# Patient Record
Sex: Female | Born: 1950 | Race: White | Hispanic: No | Marital: Single | Smoking: Never smoker
Health system: Southern US, Community
[De-identification: ages and names within clinical notes are randomized; demographics above are authoritative.]

## PROBLEM LIST (undated history)

## (undated) DIAGNOSIS — K219 Gastro-esophageal reflux disease without esophagitis: Secondary | ICD-10-CM

## (undated) DIAGNOSIS — E669 Obesity, unspecified: Secondary | ICD-10-CM

## (undated) DIAGNOSIS — M797 Fibromyalgia: Secondary | ICD-10-CM

## (undated) DIAGNOSIS — E785 Hyperlipidemia, unspecified: Secondary | ICD-10-CM

## (undated) DIAGNOSIS — D2330 Other benign neoplasm of skin of unspecified part of face: Secondary | ICD-10-CM

## (undated) DIAGNOSIS — M1990 Unspecified osteoarthritis, unspecified site: Secondary | ICD-10-CM

## (undated) DIAGNOSIS — IMO0002 Reserved for concepts with insufficient information to code with codable children: Secondary | ICD-10-CM

## (undated) DIAGNOSIS — D649 Anemia, unspecified: Secondary | ICD-10-CM

## (undated) DIAGNOSIS — F329 Major depressive disorder, single episode, unspecified: Secondary | ICD-10-CM

## (undated) DIAGNOSIS — J309 Allergic rhinitis, unspecified: Secondary | ICD-10-CM

## (undated) DIAGNOSIS — F32A Depression, unspecified: Secondary | ICD-10-CM

## (undated) DIAGNOSIS — I10 Essential (primary) hypertension: Secondary | ICD-10-CM

## (undated) DIAGNOSIS — L299 Pruritus, unspecified: Secondary | ICD-10-CM

## (undated) DIAGNOSIS — E739 Lactose intolerance, unspecified: Secondary | ICD-10-CM

## (undated) HISTORY — DX: Unspecified osteoarthritis, unspecified site: M19.90

## (undated) HISTORY — DX: Gastro-esophageal reflux disease without esophagitis: K21.9

## (undated) HISTORY — DX: Anemia, unspecified: D64.9

## (undated) HISTORY — DX: Depression, unspecified: F32.A

## (undated) HISTORY — DX: Hyperlipidemia, unspecified: E78.5

## (undated) HISTORY — DX: Obesity, unspecified: E66.9

## (undated) HISTORY — PX: TONSILLECTOMY: SHX5217

## (undated) HISTORY — PX: CARPAL TUNNEL RELEASE: SHX101

## (undated) HISTORY — DX: Fibromyalgia: M79.7

## (undated) HISTORY — DX: Essential (primary) hypertension: I10

## (undated) HISTORY — DX: Allergic rhinitis, unspecified: J30.9

## (undated) HISTORY — PX: KNEE ARTHROSCOPY: SHX127

## (undated) HISTORY — DX: Pruritus, unspecified: L29.9

## (undated) HISTORY — DX: Other benign neoplasm of skin of unspecified part of face: D23.30

## (undated) HISTORY — DX: Major depressive disorder, single episode, unspecified: F32.9

## (undated) HISTORY — DX: Reserved for concepts with insufficient information to code with codable children: IMO0002

## (undated) HISTORY — DX: Lactose intolerance, unspecified: E73.9

---

## 1998-02-18 ENCOUNTER — Ambulatory Visit (HOSPITAL_BASED_OUTPATIENT_CLINIC_OR_DEPARTMENT_OTHER): Admission: RE | Admit: 1998-02-18 | Discharge: 1998-02-18 | Payer: Self-pay | Admitting: Orthopedic Surgery

## 2000-04-05 ENCOUNTER — Encounter: Payer: Self-pay | Admitting: Internal Medicine

## 2000-11-16 ENCOUNTER — Other Ambulatory Visit: Admission: RE | Admit: 2000-11-16 | Discharge: 2000-11-16 | Payer: Self-pay | Admitting: Internal Medicine

## 2001-08-28 ENCOUNTER — Other Ambulatory Visit: Admission: RE | Admit: 2001-08-28 | Discharge: 2001-08-28 | Payer: Self-pay | Admitting: Internal Medicine

## 2002-11-21 ENCOUNTER — Encounter: Admission: RE | Admit: 2002-11-21 | Discharge: 2002-11-21 | Payer: Self-pay | Admitting: Internal Medicine

## 2003-11-14 ENCOUNTER — Ambulatory Visit: Payer: Self-pay | Admitting: Internal Medicine

## 2003-11-14 ENCOUNTER — Other Ambulatory Visit: Admission: RE | Admit: 2003-11-14 | Discharge: 2003-11-14 | Payer: Self-pay | Admitting: Internal Medicine

## 2004-07-09 ENCOUNTER — Ambulatory Visit: Payer: Self-pay | Admitting: Internal Medicine

## 2004-10-15 ENCOUNTER — Ambulatory Visit: Payer: Self-pay | Admitting: Internal Medicine

## 2004-11-19 ENCOUNTER — Ambulatory Visit: Payer: Self-pay | Admitting: Internal Medicine

## 2004-11-26 ENCOUNTER — Ambulatory Visit: Payer: Self-pay | Admitting: Internal Medicine

## 2005-03-07 ENCOUNTER — Ambulatory Visit: Payer: Self-pay | Admitting: Internal Medicine

## 2005-03-21 ENCOUNTER — Ambulatory Visit: Payer: Self-pay | Admitting: Internal Medicine

## 2005-04-18 ENCOUNTER — Ambulatory Visit: Payer: Self-pay | Admitting: Internal Medicine

## 2005-06-20 ENCOUNTER — Ambulatory Visit: Payer: Self-pay | Admitting: Internal Medicine

## 2005-07-14 ENCOUNTER — Encounter: Admission: RE | Admit: 2005-07-14 | Discharge: 2005-07-14 | Payer: Self-pay | Admitting: Internal Medicine

## 2005-07-22 ENCOUNTER — Encounter: Admission: RE | Admit: 2005-07-22 | Discharge: 2005-07-22 | Payer: Self-pay | Admitting: Internal Medicine

## 2005-09-29 ENCOUNTER — Ambulatory Visit: Payer: Self-pay | Admitting: Internal Medicine

## 2005-11-14 ENCOUNTER — Ambulatory Visit: Payer: Self-pay | Admitting: Internal Medicine

## 2005-11-14 LAB — CONVERTED CEMR LAB
ALT: 18 units/L (ref 0–40)
AST: 20 units/L (ref 0–37)
Albumin: 3.4 g/dL — ABNORMAL LOW (ref 3.5–5.2)
Alkaline Phosphatase: 106 units/L (ref 39–117)
BUN: 13 mg/dL (ref 6–23)
Basophils Absolute: 0.1 10*3/uL (ref 0.0–0.1)
Basophils Relative: 1.2 % — ABNORMAL HIGH (ref 0.0–1.0)
CO2: 31 meq/L (ref 19–32)
Calcium: 9.4 mg/dL (ref 8.4–10.5)
Chloride: 102 meq/L (ref 96–112)
Chol/HDL Ratio, serum: 5.2
Cholesterol: 192 mg/dL (ref 0–200)
Creatinine, Ser: 0.8 mg/dL (ref 0.4–1.2)
Eosinophil percent: 0.8 % (ref 0.0–5.0)
GFR calc non Af Amer: 79 mL/min
Glomerular Filtration Rate, Af Am: 95 mL/min/{1.73_m2}
Glucose, Bld: 121 mg/dL — ABNORMAL HIGH (ref 70–99)
HCT: 38.7 % (ref 36.0–46.0)
HDL: 36.7 mg/dL — ABNORMAL LOW (ref >39.0)
Hemoglobin: 12.7 g/dL (ref 12.0–15.0)
Hgb A1c MFr Bld: 6 % (ref 4.6–6.0)
LDL Cholesterol: 134 mg/dL — ABNORMAL HIGH (ref 0–99)
Lymphocytes Relative: 30.4 % (ref 12.0–46.0)
MCHC: 32.8 g/dL (ref 30.0–36.0)
MCV: 85.7 fL (ref 78.0–100.0)
Monocytes Absolute: 0.5 10*3/uL (ref 0.2–0.7)
Monocytes Relative: 5.6 % (ref 3.0–11.0)
Neutro Abs: 5.5 10*3/uL (ref 1.4–7.7)
Neutrophils Relative %: 62 % (ref 43.0–77.0)
Platelets: 311 10*3/uL (ref 150–400)
Potassium: 4.3 meq/L (ref 3.5–5.1)
RBC: 4.51 M/uL (ref 3.87–5.11)
RDW: 14.3 % (ref 11.5–14.6)
Sodium: 139 meq/L (ref 135–145)
TSH: 0.96 microintl units/mL (ref 0.35–5.50)
Total Bilirubin: 0.7 mg/dL (ref 0.3–1.2)
Total Protein: 7 g/dL (ref 6.0–8.3)
Triglyceride fasting, serum: 106 mg/dL (ref 0–149)
VLDL: 21 mg/dL (ref 0–40)
WBC: 8.9 10*3/uL (ref 4.5–10.5)

## 2005-11-21 ENCOUNTER — Ambulatory Visit: Payer: Self-pay | Admitting: Internal Medicine

## 2006-03-07 ENCOUNTER — Encounter: Payer: Self-pay | Admitting: Internal Medicine

## 2006-03-08 ENCOUNTER — Ambulatory Visit: Payer: Self-pay | Admitting: Internal Medicine

## 2006-06-21 ENCOUNTER — Ambulatory Visit: Payer: Self-pay | Admitting: Internal Medicine

## 2006-07-10 ENCOUNTER — Encounter: Payer: Self-pay | Admitting: Internal Medicine

## 2006-07-10 ENCOUNTER — Ambulatory Visit: Payer: Self-pay | Admitting: Internal Medicine

## 2006-07-10 DIAGNOSIS — K219 Gastro-esophageal reflux disease without esophagitis: Secondary | ICD-10-CM

## 2006-07-10 DIAGNOSIS — E785 Hyperlipidemia, unspecified: Secondary | ICD-10-CM

## 2006-07-10 DIAGNOSIS — I10 Essential (primary) hypertension: Secondary | ICD-10-CM

## 2006-07-10 HISTORY — DX: Hyperlipidemia, unspecified: E78.5

## 2006-07-10 HISTORY — DX: Gastro-esophageal reflux disease without esophagitis: K21.9

## 2006-07-10 HISTORY — DX: Essential (primary) hypertension: I10

## 2006-07-19 ENCOUNTER — Encounter: Payer: Self-pay | Admitting: Internal Medicine

## 2006-07-19 ENCOUNTER — Encounter: Admission: RE | Admit: 2006-07-19 | Discharge: 2006-07-19 | Payer: Self-pay | Admitting: Internal Medicine

## 2006-10-03 ENCOUNTER — Encounter: Payer: Self-pay | Admitting: Internal Medicine

## 2006-10-04 ENCOUNTER — Ambulatory Visit: Payer: Self-pay | Admitting: Internal Medicine

## 2006-10-04 DIAGNOSIS — J069 Acute upper respiratory infection, unspecified: Secondary | ICD-10-CM | POA: Insufficient documentation

## 2006-11-09 ENCOUNTER — Ambulatory Visit: Payer: Self-pay | Admitting: Internal Medicine

## 2006-11-09 LAB — CONVERTED CEMR LAB
ALT: 16 units/L (ref 0–35)
AST: 15 units/L (ref 0–37)
Albumin: 3.7 g/dL (ref 3.5–5.2)
Alkaline Phosphatase: 142 units/L — ABNORMAL HIGH (ref 39–117)
BUN: 10 mg/dL (ref 6–23)
Basophils Absolute: 0.1 10*3/uL (ref 0.0–0.1)
Basophils Relative: 1 % (ref 0.0–1.0)
Bilirubin Urine: NEGATIVE
Bilirubin, Direct: 0.2 mg/dL (ref 0.0–0.3)
Blood in Urine, dipstick: NEGATIVE
CO2: 33 meq/L — ABNORMAL HIGH (ref 19–32)
Calcium: 10.2 mg/dL (ref 8.4–10.5)
Chloride: 104 meq/L (ref 96–112)
Cholesterol: 226 mg/dL (ref 0–200)
Creatinine, Ser: 0.8 mg/dL (ref 0.4–1.2)
Direct LDL: 154.3 mg/dL
Eosinophils Absolute: 0.1 10*3/uL (ref 0.0–0.6)
Eosinophils Relative: 0.5 % (ref 0.0–5.0)
GFR calc Af Amer: 95 mL/min
GFR calc non Af Amer: 79 mL/min
Glucose, Bld: 109 mg/dL — ABNORMAL HIGH (ref 70–99)
Glucose, Urine, Semiquant: NEGATIVE
HCT: 37.2 % (ref 36.0–46.0)
HDL: 33.8 mg/dL — ABNORMAL LOW (ref >39.0)
Hemoglobin: 12.6 g/dL (ref 12.0–15.0)
Ketones, urine, test strip: NEGATIVE
Lymphocytes Relative: 28.8 % (ref 12.0–46.0)
MCHC: 33.9 g/dL (ref 30.0–36.0)
MCV: 86.8 fL (ref 78.0–100.0)
Monocytes Absolute: 0.6 10*3/uL (ref 0.2–0.7)
Monocytes Relative: 5.3 % (ref 3.0–11.0)
Neutro Abs: 6.6 10*3/uL (ref 1.4–7.7)
Neutrophils Relative %: 64.4 % (ref 43.0–77.0)
Nitrite: NEGATIVE
Platelets: 313 10*3/uL (ref 150–400)
Potassium: 4.7 meq/L (ref 3.5–5.1)
Protein, U semiquant: NEGATIVE
RBC: 4.28 M/uL (ref 3.87–5.11)
RDW: 14.1 % (ref 11.5–14.6)
Sodium: 145 meq/L (ref 135–145)
Specific Gravity, Urine: 1.02
TSH: 0.78 microintl units/mL (ref 0.35–5.50)
Total Bilirubin: 0.9 mg/dL (ref 0.3–1.2)
Total CHOL/HDL Ratio: 6.7
Total Protein: 6.8 g/dL (ref 6.0–8.3)
Triglycerides: 234 mg/dL (ref 0–149)
Urobilinogen, UA: 0.2
VLDL: 47 mg/dL — ABNORMAL HIGH (ref 0–40)
WBC Urine, dipstick: NEGATIVE
WBC: 10.4 10*3/uL (ref 4.5–10.5)
pH: 6.5

## 2006-11-28 ENCOUNTER — Ambulatory Visit: Payer: Self-pay | Admitting: Internal Medicine

## 2006-11-28 ENCOUNTER — Encounter: Payer: Self-pay | Admitting: Internal Medicine

## 2006-11-28 ENCOUNTER — Other Ambulatory Visit: Admission: RE | Admit: 2006-11-28 | Discharge: 2006-11-28 | Payer: Self-pay | Admitting: Internal Medicine

## 2006-11-28 DIAGNOSIS — J309 Allergic rhinitis, unspecified: Secondary | ICD-10-CM | POA: Insufficient documentation

## 2006-11-28 HISTORY — DX: Allergic rhinitis, unspecified: J30.9

## 2007-02-26 ENCOUNTER — Ambulatory Visit: Payer: Self-pay | Admitting: Internal Medicine

## 2007-02-26 DIAGNOSIS — IMO0002 Reserved for concepts with insufficient information to code with codable children: Secondary | ICD-10-CM

## 2007-02-26 HISTORY — DX: Reserved for concepts with insufficient information to code with codable children: IMO0002

## 2007-03-22 ENCOUNTER — Ambulatory Visit: Payer: Self-pay | Admitting: Internal Medicine

## 2007-03-22 DIAGNOSIS — M549 Dorsalgia, unspecified: Secondary | ICD-10-CM | POA: Insufficient documentation

## 2007-03-27 ENCOUNTER — Encounter: Admission: RE | Admit: 2007-03-27 | Discharge: 2007-03-27 | Payer: Self-pay | Admitting: Internal Medicine

## 2007-04-18 ENCOUNTER — Encounter: Payer: Self-pay | Admitting: Internal Medicine

## 2007-04-30 ENCOUNTER — Encounter: Payer: Self-pay | Admitting: Internal Medicine

## 2007-05-01 ENCOUNTER — Ambulatory Visit: Payer: Self-pay | Admitting: Internal Medicine

## 2007-05-16 ENCOUNTER — Ambulatory Visit: Payer: Self-pay

## 2007-05-16 ENCOUNTER — Encounter: Payer: Self-pay | Admitting: Internal Medicine

## 2007-05-24 ENCOUNTER — Telehealth: Payer: Self-pay | Admitting: Internal Medicine

## 2007-05-28 ENCOUNTER — Telehealth: Payer: Self-pay | Admitting: Internal Medicine

## 2007-06-11 ENCOUNTER — Inpatient Hospital Stay (HOSPITAL_COMMUNITY): Admission: RE | Admit: 2007-06-11 | Discharge: 2007-06-13 | Payer: Self-pay | Admitting: Neurosurgery

## 2007-06-11 HISTORY — PX: LUMBAR LAMINECTOMY: SHX95

## 2007-07-12 ENCOUNTER — Encounter: Admission: RE | Admit: 2007-07-12 | Discharge: 2007-07-12 | Payer: Self-pay | Admitting: Neurosurgery

## 2007-07-25 ENCOUNTER — Encounter: Payer: Self-pay | Admitting: Internal Medicine

## 2007-08-15 ENCOUNTER — Encounter: Payer: Self-pay | Admitting: Internal Medicine

## 2007-08-17 ENCOUNTER — Ambulatory Visit: Payer: Self-pay | Admitting: Internal Medicine

## 2007-09-13 ENCOUNTER — Encounter: Payer: Self-pay | Admitting: Internal Medicine

## 2007-09-28 ENCOUNTER — Ambulatory Visit: Payer: Self-pay | Admitting: Internal Medicine

## 2007-09-28 DIAGNOSIS — L299 Pruritus, unspecified: Secondary | ICD-10-CM | POA: Insufficient documentation

## 2007-09-28 HISTORY — DX: Pruritus, unspecified: L29.9

## 2007-10-30 ENCOUNTER — Ambulatory Visit: Payer: Self-pay | Admitting: Internal Medicine

## 2007-11-14 ENCOUNTER — Encounter: Payer: Self-pay | Admitting: Internal Medicine

## 2007-11-14 ENCOUNTER — Ambulatory Visit: Payer: Self-pay | Admitting: Internal Medicine

## 2007-11-23 ENCOUNTER — Ambulatory Visit: Payer: Self-pay | Admitting: Internal Medicine

## 2007-11-23 ENCOUNTER — Encounter: Admission: RE | Admit: 2007-11-23 | Discharge: 2007-11-23 | Payer: Self-pay | Admitting: Internal Medicine

## 2007-11-23 LAB — CONVERTED CEMR LAB
ALT: 17 units/L (ref 0–35)
AST: 18 units/L (ref 0–37)
Albumin: 3.7 g/dL (ref 3.5–5.2)
Alkaline Phosphatase: 136 units/L — ABNORMAL HIGH (ref 39–117)
BUN: 17 mg/dL (ref 6–23)
Basophils Absolute: 0 10*3/uL (ref 0.0–0.1)
Basophils Relative: 0.5 % (ref 0.0–3.0)
Bilirubin Urine: NEGATIVE
Bilirubin, Direct: 0.1 mg/dL (ref 0.0–0.3)
Blood in Urine, dipstick: NEGATIVE
CO2: 27 meq/L (ref 19–32)
Calcium: 9.5 mg/dL (ref 8.4–10.5)
Chloride: 106 meq/L (ref 96–112)
Cholesterol: 193 mg/dL (ref 0–200)
Creatinine, Ser: 0.7 mg/dL (ref 0.4–1.2)
Eosinophils Absolute: 0.1 10*3/uL (ref 0.0–0.7)
Eosinophils Relative: 0.9 % (ref 0.0–5.0)
GFR calc Af Amer: 111 mL/min
GFR calc non Af Amer: 91 mL/min
Glucose, Bld: 128 mg/dL — ABNORMAL HIGH (ref 70–99)
Glucose, Urine, Semiquant: NEGATIVE
HCT: 34.1 % — ABNORMAL LOW (ref 36.0–46.0)
HDL: 32.4 mg/dL — ABNORMAL LOW (ref >39.0)
Hemoglobin: 11.5 g/dL — ABNORMAL LOW (ref 12.0–15.0)
Ketones, urine, test strip: NEGATIVE
LDL Cholesterol: 124 mg/dL — ABNORMAL HIGH (ref 0–99)
Lymphocytes Relative: 27.1 % (ref 12.0–46.0)
MCHC: 33.8 g/dL (ref 30.0–36.0)
MCV: 82.1 fL (ref 78.0–100.0)
Monocytes Absolute: 0.5 10*3/uL (ref 0.1–1.0)
Monocytes Relative: 5.8 % (ref 3.0–12.0)
Neutro Abs: 6 10*3/uL (ref 1.4–7.7)
Neutrophils Relative %: 65.7 % (ref 43.0–77.0)
Nitrite: NEGATIVE
Platelets: 297 10*3/uL (ref 150–400)
Potassium: 4.3 meq/L (ref 3.5–5.1)
Protein, U semiquant: NEGATIVE
RBC: 4.16 M/uL (ref 3.87–5.11)
RDW: 16.1 % — ABNORMAL HIGH (ref 11.5–14.6)
Sodium: 142 meq/L (ref 135–145)
Specific Gravity, Urine: 1.02
TSH: 1.33 microintl units/mL (ref 0.35–5.50)
Total Bilirubin: 0.7 mg/dL (ref 0.3–1.2)
Total CHOL/HDL Ratio: 6
Total Protein: 7.2 g/dL (ref 6.0–8.3)
Triglycerides: 185 mg/dL — ABNORMAL HIGH (ref 0–149)
Urobilinogen, UA: 0.2
VLDL: 37 mg/dL (ref 0–40)
WBC: 9.1 10*3/uL (ref 4.5–10.5)
pH: 5.5

## 2007-11-29 ENCOUNTER — Ambulatory Visit: Payer: Self-pay | Admitting: Internal Medicine

## 2008-03-12 ENCOUNTER — Encounter: Payer: Self-pay | Admitting: Internal Medicine

## 2008-04-02 ENCOUNTER — Ambulatory Visit: Payer: Self-pay | Admitting: Internal Medicine

## 2008-04-02 DIAGNOSIS — E739 Lactose intolerance, unspecified: Secondary | ICD-10-CM

## 2008-04-02 DIAGNOSIS — D649 Anemia, unspecified: Secondary | ICD-10-CM | POA: Insufficient documentation

## 2008-04-02 HISTORY — DX: Anemia, unspecified: D64.9

## 2008-04-02 HISTORY — DX: Lactose intolerance, unspecified: E73.9

## 2008-04-02 LAB — CONVERTED CEMR LAB
Basophils Absolute: 0.1 10*3/uL (ref 0.0–0.1)
Basophils Relative: 0.7 % (ref 0.0–3.0)
Eosinophils Absolute: 0.1 10*3/uL (ref 0.0–0.7)
Eosinophils Relative: 1 % (ref 0.0–5.0)
HCT: 36.9 % (ref 36.0–46.0)
Hemoglobin: 12.3 g/dL (ref 12.0–15.0)
Hgb A1c MFr Bld: 6.5 % (ref 4.6–6.5)
Lymphocytes Relative: 27.1 % (ref 12.0–46.0)
Lymphs Abs: 2.3 10*3/uL (ref 0.7–4.0)
MCHC: 33.4 g/dL (ref 30.0–36.0)
MCV: 84.4 fL (ref 78.0–100.0)
Monocytes Absolute: 0.6 10*3/uL (ref 0.1–1.0)
Monocytes Relative: 6.7 % (ref 3.0–12.0)
Neutro Abs: 5.3 10*3/uL (ref 1.4–7.7)
Neutrophils Relative %: 64.5 % (ref 43.0–77.0)
Platelets: 313 10*3/uL (ref 150.0–400.0)
RBC: 4.38 M/uL (ref 3.87–5.11)
RDW: 16 % — ABNORMAL HIGH (ref 11.5–14.6)
WBC: 8.4 10*3/uL (ref 4.5–10.5)

## 2008-07-18 ENCOUNTER — Ambulatory Visit: Payer: Self-pay | Admitting: Internal Medicine

## 2008-07-18 DIAGNOSIS — D2330 Other benign neoplasm of skin of unspecified part of face: Secondary | ICD-10-CM | POA: Insufficient documentation

## 2008-07-18 HISTORY — DX: Other benign neoplasm of skin of unspecified part of face: D23.30

## 2008-10-22 ENCOUNTER — Encounter: Admission: RE | Admit: 2008-10-22 | Discharge: 2008-10-22 | Payer: Self-pay | Admitting: Neurosurgery

## 2008-12-01 ENCOUNTER — Ambulatory Visit: Payer: Self-pay | Admitting: Internal Medicine

## 2008-12-01 LAB — CONVERTED CEMR LAB
ALT: 13 units/L (ref 0–35)
AST: 15 units/L (ref 0–37)
Albumin: 3.6 g/dL (ref 3.5–5.2)
Alkaline Phosphatase: 128 units/L — ABNORMAL HIGH (ref 39–117)
BUN: 8 mg/dL (ref 6–23)
Basophils Absolute: 0 10*3/uL (ref 0.0–0.1)
Basophils Relative: 0.6 % (ref 0.0–3.0)
Bilirubin Urine: NEGATIVE
Bilirubin, Direct: 0 mg/dL (ref 0.0–0.3)
CO2: 30 meq/L (ref 19–32)
Calcium: 9.3 mg/dL (ref 8.4–10.5)
Chloride: 102 meq/L (ref 96–112)
Cholesterol: 249 mg/dL — ABNORMAL HIGH (ref 0–200)
Creatinine, Ser: 0.7 mg/dL (ref 0.4–1.2)
Direct LDL: 189.1 mg/dL
Eosinophils Absolute: 0.1 10*3/uL (ref 0.0–0.7)
Eosinophils Relative: 0.8 % (ref 0.0–5.0)
GFR calc non Af Amer: 90.75 mL/min (ref >60)
Glucose, Bld: 125 mg/dL — ABNORMAL HIGH (ref 70–99)
Glucose, Urine, Semiquant: NEGATIVE
HCT: 37.6 % (ref 36.0–46.0)
HDL: 35.8 mg/dL — ABNORMAL LOW (ref >39.00)
Hemoglobin: 12.5 g/dL (ref 12.0–15.0)
Hgb A1c MFr Bld: 6.5 % (ref 4.6–6.5)
Ketones, urine, test strip: NEGATIVE
Lymphocytes Relative: 34.4 % (ref 12.0–46.0)
Lymphs Abs: 2.6 10*3/uL (ref 0.7–4.0)
MCHC: 33.4 g/dL (ref 30.0–36.0)
MCV: 86.8 fL (ref 78.0–100.0)
Monocytes Absolute: 0.4 10*3/uL (ref 0.1–1.0)
Monocytes Relative: 6 % (ref 3.0–12.0)
Neutro Abs: 4.4 10*3/uL (ref 1.4–7.7)
Neutrophils Relative %: 58.2 % (ref 43.0–77.0)
Nitrite: NEGATIVE
Platelets: 251 10*3/uL (ref 150.0–400.0)
Potassium: 3.7 meq/L (ref 3.5–5.1)
Protein, U semiquant: NEGATIVE
RBC: 4.33 M/uL (ref 3.87–5.11)
RDW: 14.5 % (ref 11.5–14.6)
Sodium: 142 meq/L (ref 135–145)
Specific Gravity, Urine: 1.02
TSH: 0.75 microintl units/mL (ref 0.35–5.50)
Total Bilirubin: 0.8 mg/dL (ref 0.3–1.2)
Total CHOL/HDL Ratio: 7
Total Protein: 7.4 g/dL (ref 6.0–8.3)
Triglycerides: 220 mg/dL — ABNORMAL HIGH (ref 0.0–149.0)
Urobilinogen, UA: 0.2
VLDL: 44 mg/dL — ABNORMAL HIGH (ref 0.0–40.0)
WBC Urine, dipstick: NEGATIVE
WBC: 7.5 10*3/uL (ref 4.5–10.5)
pH: 5.5

## 2008-12-12 ENCOUNTER — Ambulatory Visit: Payer: Self-pay | Admitting: Internal Medicine

## 2009-01-07 ENCOUNTER — Encounter: Admission: RE | Admit: 2009-01-07 | Discharge: 2009-01-07 | Payer: Self-pay | Admitting: Internal Medicine

## 2009-02-20 ENCOUNTER — Telehealth: Payer: Self-pay | Admitting: Internal Medicine

## 2009-05-12 ENCOUNTER — Ambulatory Visit: Payer: Self-pay | Admitting: Internal Medicine

## 2009-10-07 ENCOUNTER — Ambulatory Visit: Payer: Self-pay | Admitting: Internal Medicine

## 2009-12-02 ENCOUNTER — Ambulatory Visit: Payer: Self-pay | Admitting: Internal Medicine

## 2009-12-02 LAB — CONVERTED CEMR LAB
ALT: 10 units/L (ref 0–35)
AST: 12 units/L (ref 0–37)
Albumin: 4.2 g/dL (ref 3.5–5.2)
Alkaline Phosphatase: 126 units/L — ABNORMAL HIGH (ref 39–117)
BUN: 14 mg/dL (ref 6–23)
Basophils Absolute: 0.1 10*3/uL (ref 0.0–0.1)
Basophils Relative: 0.6 % (ref 0.0–3.0)
Bilirubin Urine: NEGATIVE
Bilirubin, Direct: 0.1 mg/dL (ref 0.0–0.3)
Blood in Urine, dipstick: NEGATIVE
CO2: 28 meq/L (ref 19–32)
Calcium: 9.7 mg/dL (ref 8.4–10.5)
Chloride: 99 meq/L (ref 96–112)
Cholesterol: 267 mg/dL — ABNORMAL HIGH (ref 0–200)
Creatinine, Ser: 0.77 mg/dL (ref 0.40–1.20)
Eosinophils Absolute: 0.1 10*3/uL (ref 0.0–0.7)
Eosinophils Relative: 0.9 % (ref 0.0–5.0)
Glucose, Bld: 114 mg/dL — ABNORMAL HIGH (ref 70–99)
Glucose, Urine, Semiquant: NEGATIVE
HCT: 40.2 % (ref 36.0–46.0)
HDL: 40 mg/dL (ref >39)
Hemoglobin: 13.5 g/dL (ref 12.0–15.0)
Indirect Bilirubin: 0.7 mg/dL (ref 0.0–0.9)
Ketones, urine, test strip: NEGATIVE
LDL Cholesterol: 171 mg/dL — ABNORMAL HIGH (ref 0–99)
Lymphocytes Relative: 32.5 % (ref 12.0–46.0)
Lymphs Abs: 3.2 10*3/uL (ref 0.7–4.0)
MCHC: 33.7 g/dL (ref 30.0–36.0)
MCV: 86.8 fL (ref 78.0–100.0)
Monocytes Absolute: 0.6 10*3/uL (ref 0.1–1.0)
Monocytes Relative: 5.8 % (ref 3.0–12.0)
Neutro Abs: 5.9 10*3/uL (ref 1.4–7.7)
Neutrophils Relative %: 60.2 % (ref 43.0–77.0)
Nitrite: NEGATIVE
Platelets: 253 10*3/uL (ref 150.0–400.0)
Potassium: 4.3 meq/L (ref 3.5–5.3)
Protein, U semiquant: NEGATIVE
RBC: 4.63 M/uL (ref 3.87–5.11)
RDW: 15.8 % — ABNORMAL HIGH (ref 11.5–14.6)
Sodium: 139 meq/L (ref 135–145)
Specific Gravity, Urine: 1.01
TSH: 1.386 microintl units/mL (ref 0.350–4.500)
Total Bilirubin: 0.8 mg/dL (ref 0.3–1.2)
Total CHOL/HDL Ratio: 6.7
Total Protein: 7 g/dL (ref 6.0–8.3)
Triglycerides: 281 mg/dL — ABNORMAL HIGH (ref <150)
Urobilinogen, UA: 0.2
VLDL: 56 mg/dL — ABNORMAL HIGH (ref 0–40)
WBC: 9.8 10*3/uL (ref 4.5–10.5)
pH: 5.5

## 2009-12-14 ENCOUNTER — Ambulatory Visit: Payer: Self-pay | Admitting: Internal Medicine

## 2009-12-14 ENCOUNTER — Encounter: Payer: Self-pay | Admitting: Internal Medicine

## 2010-01-06 ENCOUNTER — Encounter
Admission: RE | Admit: 2010-01-06 | Discharge: 2010-01-06 | Payer: Self-pay | Source: Home / Self Care | Attending: Internal Medicine | Admitting: Internal Medicine

## 2010-01-30 ENCOUNTER — Encounter: Payer: Self-pay | Admitting: Internal Medicine

## 2010-01-31 ENCOUNTER — Encounter: Payer: Self-pay | Admitting: Internal Medicine

## 2010-02-09 NOTE — Progress Notes (Signed)
Summary: Pt req list of pts current medications  Phone Note Call from Patient Call back at Home Phone [phone]   Caller: Patient Summary of Call: Pt lost current list of medications and would like to rcv another copy. Please call when ready for pick up or mail them to patient. Initial call taken by: Lucy Antigua,  February 20, 2009 8:26 AM  Follow-up for Phone Call        notified pt  , med list ready for p/u  Follow-up by: Duard Brady LPN,  February 20, 2009 9:12 AM

## 2010-02-09 NOTE — Assessment & Plan Note (Signed)
Summary: consult re: back pain and pain in lft side/cjr   Vital Signs:  Williams profile:   60 year old female Weight:      216 pounds Temp:     98.1 degrees F oral BP sitting:   142 / 74 Cuff size:   regular  Vitals Entered By: Duard Brady LPN (May 12, 1608 10:29 AM) CC: c/o (L) flank pain, (L) arm pain & numbness in toes   CC:  c/o (L) flank pain and (L) arm pain & numbness in toes.  History of Present Illness: Erin Williams who is seen today for follow up of her hypertension.  She is scheduled to see her back physician tomorrow.  She does have a history of chronic back pain.  She is status post lumbar laminectomy approximately 2 years ago.  She has dyslipidemia, gastroesophageal reflux disease.  New complaints of anklets a mild left flank discomfort  Preventive Screening-Counseling & Management  Alcohol-Tobacco     Smoking Status: never  Allergies: 1)  ! Iodine (Iodine) 2)  ! Phenylbutazone (Phenylbutazone) 3)  ! Septra Ds (Sulfamethoxazole-Trimethoprim) 4)  ! Tetracycline Hcl (Tetracycline Hcl) 5)  ! Theophylline Cr (Theophylline) 6)  Vicodin (Hydrocodone-Acetaminophen)  Past History:  Past Medical History: Reviewed history from 11/29/2007 and no changes required. GERD Hyperlipidemia Hypertension Fibromyalgia Mild obesity peripheral edema Allergic rhinitis chronic low back pain  Past Surgical History: Reviewed history from 11/29/2007 and no changes required. Carpal tunnel release Tonsillectomy right knee arthroscopic surgery 2002 gravida one, para one, aborta zero  flexible sigmoidoscopy March 2002  ETT March 2007 nuclear  medicine stress test June 2009 lumbar laminectomy June 2009  normal bone density November 2009  Family History: Reviewed history from 11/28/2006 and no changes required. father history of diabetes, court-ordered disease with a history breast cancer, metastatic to the brain.  Two brothers, one sister.  Positive diabetes,  hypertension  father died age 11 of aspiration pneumonia, history of diabetes, CAD, senile dementia  Mother died age 70.  Breast cancer, metastatic to the brain  Two brothers, one sister  For diabetes, hypertension that his suicide death  Family history positive colon cancer with cousins and aunts  Social History: Smoking Status:  never  Physical Exam  General:  overweight-appearing.  130/Erin.   Head:  Normocephalic and atraumatic without obvious abnormalities. No apparent alopecia or balding. Mouth:  Oral mucosa and oropharynx without lesions or exudates.  Teeth in good repair. Neck:  No deformities, masses, or tenderness noted. Lungs:  Normal respiratory effort, chest expands symmetrically. Lungs are clear to auscultation, no crackles or wheezes. Heart:  Normal rate and regular rhythm. S1 and S2 normal without gallop, murmur, click, rub or other extra sounds. Abdomen:  Bowel sounds positive,abdomen soft and non-tender without masses, organomegaly or hernias noted.   Impression & Recommendations:  Problem # 1:  BACK PAIN (ICD-724.5)  Her updated medication list for this problem includes:    Voltaren 75 Mg Tbec (Diclofenac sodium) .Marland Kitchen... 1 tablet by mouth twice a day    Flexeril 10 Mg Tabs (Cyclobenzaprine hcl) .Marland Kitchen... 1 three times a day as needed  Her updated medication list for this problem includes:    Voltaren 75 Mg Tbec (Diclofenac sodium) .Marland Kitchen... 1 tablet by mouth twice a day    Flexeril 10 Mg Tabs (Cyclobenzaprine hcl) .Marland Kitchen... 1 three times a day as needed  Problem # 2:  HYPERLIPIDEMIA (ICD-272.4)  Her updated medication list for this problem includes:    Lipitor Erin  Mg Tabs (Atorvastatin calcium) ..... One daily  Her updated medication list for this problem includes:    Lipitor Erin Mg Tabs (Atorvastatin calcium) ..... One daily  Complete Medication List: 1)  Hydrochlorothiazide 25 Mg Tabs (Hydrochlorothiazide) .... Take 1 tablet by mouth once a day 2)  Voltaren 75 Mg  Tbec (Diclofenac sodium) .Marland Kitchen.. 1 tablet by mouth twice a day 3)  Flexeril 10 Mg Tabs (Cyclobenzaprine hcl) .Marland Kitchen.. 1 three times a day as needed 4)  Prozac 20 Mg Caps (Fluoxetine hcl) .Marland Kitchen.. 1 once daily 5)  Fluticasone Propionate 50 Mcg/act Susp (Fluticasone propionate) .... Use daily 6)  Fexofenadine Hcl 180 Mg Tabs (Fexofenadine hcl) .... One daily, as needed 7)  Gabapentin 300 Mg Caps (Gabapentin) .... One tid 8)  Ranitidine Hcl 150 Mg Tabs (Ranitidine hcl) .... One twice daily 9)  Lipitor Erin Mg Tabs (Atorvastatin calcium) .... One daily 10)  Lidoderm 5 % Ptch (Lidocaine) .Marland Kitchen.. 1-3 daily--topical  Williams Instructions: 1)  Please schedule a follow-up appointment in 4 months. 2)  Limit your Sodium (Salt) to less than 2 grams a day(slightly less than 1/2 a teaspoon) to prevent fluid retention, swelling, or worsening of symptoms. 3)  It is important that you exercise regularly at least 20 minutes 5 times a week. If you develop chest pain, have severe difficulty breathing, or feel very tired , stop exercising immediately and seek medical attention. 4)  You need to lose weight. Consider a lower calorie diet and regular exercise.  5)  Check your Blood Pressure regularly. If it is above: 150/90  you should make an appointment. 6)  Take calcium +Vitamin D daily.

## 2010-02-09 NOTE — Assessment & Plan Note (Signed)
Summary: CPX/NJR   Vital Signs:  Patient profile:   60 year old female Height:      62 inches Weight:      211 pounds BMI:     38.73 Temp:     98.0 degrees F oral BP sitting:   120 / 72  (right arm) Cuff size:   regular  Vitals Entered By: Duard Brady LPN (December 14, 2009 2:36 PM) CC: cpx - doing well Is Patient Diabetic? No   CC:  cpx - doing well.  History of Present Illness: 60 year old patient who is seen today for a health maintenance examination.  Medical problems  include hypertension, dyslipidemia, gastroesophageal reflux disease.  Allergies: 1)  ! Iodine (Iodine) 2)  ! Phenylbutazone (Phenylbutazone) 3)  ! Septra Ds (Sulfamethoxazole-Trimethoprim) 4)  ! Tetracycline Hcl (Tetracycline Hcl) 5)  ! Theophylline Cr (Theophylline) 6)  Vicodin (Hydrocodone-Acetaminophen)  Past History:  Past Medical History: Reviewed history from 11/29/2007 and no changes required. GERD Hyperlipidemia Hypertension Fibromyalgia Mild obesity peripheral edema Allergic rhinitis chronic low back pain  Past Surgical History: Reviewed history from 10/07/2009 and no changes required. Carpal tunnel release Tonsillectomy right knee arthroscopic surgery 2002 gravida one, para one, aborta zero  flexible sigmoidoscopy March 2002  ETT March 2007 nuclear  medicine stress test June 2009 lumbar laminectomy June 2009 lumbar surgery 07-2009  normal bone density November 2009  Family History: Reviewed history from 11/28/2006 and no changes required. father history of diabetes, court-ordered disease with a history breast cancer, metastatic to the brain.  Two brothers, one sister.  Positive diabetes, hypertension  father died age 21 of aspiration pneumonia, history of diabetes, CAD, senile dementia  Mother died age 12.  Breast cancer, metastatic to the brain  Two brothers, one sister  For diabetes, hypertension that his suicide death  Family history positive colon cancer  with cousins and aunts  Social History: Reviewed history from 12/12/2008 and no changes required. has not worked in approximately 2 years due to fibromyalgia one daughter lives in the  area disabled due to chronic low back pain  Review of Systems       The patient complains of difficulty walking.  The patient denies anorexia, fever, weight loss, weight gain, vision loss, decreased hearing, hoarseness, chest pain, syncope, dyspnea on exertion, peripheral edema, prolonged cough, headaches, hemoptysis, abdominal pain, melena, hematochezia, severe indigestion/heartburn, hematuria, incontinence, genital sores, muscle weakness, suspicious skin lesions, transient blindness, depression, unusual weight change, abnormal bleeding, enlarged lymph nodes, angioedema, and breast masses.    Physical Exam  General:  overweight-appearing.  overweight-appearing.   Head:  Normocephalic and atraumatic without obvious abnormalities. No apparent alopecia or balding. Eyes:  No corneal or conjunctival inflammation noted. EOMI. Perrla. Funduscopic exam benign, without hemorrhages, exudates or papilledema. Vision grossly normal. Ears:  External ear exam shows no significant lesions or deformities.  Otoscopic examination reveals clear canals, tympanic membranes are intact bilaterally without bulging, retraction, inflammation or discharge. Hearing is grossly normal bilaterally. Nose:  External nasal examination shows no deformity or inflammation. Nasal mucosa are pink and moist without lesions or exudates. Mouth:  Oral mucosa and oropharynx without lesions or exudates.  Neck:  No deformities, masses, or tenderness noted. Chest Wall:  No deformities, masses, or tenderness noted. Breasts:  No mass, nodules, thickening, tenderness, bulging, retraction, inflamation, nipple discharge or skin changes noted.   Lungs:  Normal respiratory effort, chest expands symmetrically. Lungs are clear to auscultation, no crackles or  wheezes. Heart:  Normal rate and regular  rhythm. S1 and S2 normal without gallop, murmur, click, rub or other extra sounds. Abdomen:  Bowel sounds positive,abdomen soft and non-tender without masses, organomegaly or hernias noted. Rectal:  No external abnormalities noted. Normal sphincter tone. No rectal masses or tenderness. Genitalia:  Normal introitus for age, no external lesions, no vaginal discharge, mucosa pink and moist, no vaginal or cervical lesions, no vaginal atrophy, no friaility or hemorrhage, normal uterus size and position, no adnexal masses or tenderness Msk:  No deformity or scoliosis noted of thoracic or lumbar spine.   Pulses:  right dorsalis pedis pulse.  Full ; other pedal pulse is not easily palpable Extremities:  No clubbing, cyanosis, edema, or deformity noted with normal full range of motion of all joints.   Neurologic:  No cranial nerve deficits noted. Station and gait are normal. Plantar reflexes are down-going bilaterally. DTRs are symmetrical throughout. Sensory, motor and coordinative functions appear intact. Skin:  Intact without suspicious lesions or rashes Cervical Nodes:  No lymphadenopathy noted Axillary Nodes:  No palpable lymphadenopathy Inguinal Nodes:  No significant adenopathy Psych:  Cognition and judgment appear intact. Alert and cooperative with normal attention span and concentration. No apparent delusions, illusions, hallucinations   Impression & Recommendations:  Problem # 1:  PREVENTIVE HEALTH CARE (ICD-V70.0)  Complete Medication List: 1)  Hydrochlorothiazide 25 Mg Tabs (Hydrochlorothiazide) .... Take 1 tablet by mouth once a day 2)  Flexeril 10 Mg Tabs (Cyclobenzaprine hcl) .Marland Kitchen.. 1 three times a day as needed 3)  Prozac 20 Mg Caps (Fluoxetine hcl) .Marland Kitchen.. 1 once daily 4)  Fluticasone Propionate 50 Mcg/act Susp (Fluticasone propionate) .... Use daily 5)  Fexofenadine Hcl 180 Mg Tabs (Fexofenadine hcl) .... One daily, as needed 6)  Gabapentin 300  Mg Caps (Gabapentin) .... One tid 7)  Ranitidine Hcl 150 Mg Tabs (Ranitidine hcl) .... One twice daily 8)  Lipitor 80 Mg Tabs (Atorvastatin calcium) .... One daily 9)  Lidoderm 5 % Ptch (Lidocaine) .Marland Kitchen.. 1-3 daily--topical 10)  Celebrex 100 Mg Caps (Celecoxib) .... Qd 11)  Vitamin D 1000 Unit Tabs (Cholecalciferol) .... Qd 12)  Calcium 1500 Mg Tabs (Calcium carbonate) .... Qd  Other Orders: EKG w/ Interpretation (93000)  Patient Instructions: 1)  Please schedule a follow-up appointment in 6 months. 2)  Limit your Sodium (Salt). 3)  It is important that you exercise regularly at least 20 minutes 5 times a week. If you develop chest pain, have severe difficulty breathing, or feel very tired , stop exercising immediately and seek medical attention. 4)  You need to lose weight. Consider a lower calorie diet and regular exercise.  5)  Schedule a colonoscopy/sigmoidoscopy to help detect colon cancer. 6)  Take calcium +Vitamin D daily. Prescriptions: LIPITOR 80 MG TABS (ATORVASTATIN CALCIUM) one daily  #90 x 6   Entered and Authorized by:   Gordy Savers  MD   Signed by:   Gordy Savers  MD on 12/14/2009   Method used:   Electronically to        [id number] * (retail)       [address]       [place], [place]  [address]       Ph: [phone]       Fax: [phone]   RxID:   [id number] RANITIDINE HCL 150 MG TABS (RANITIDINE HCL) one twice daily  #180 x 6   Entered and Authorized by:   Gordy Savers  MD   Signed by:  Gordy Savers  MD on 12/14/2009   Method used:   Electronically to        [id number] * (retail)       [address]       [place], [place]  [address]       Ph: [phone]       Fax: [phone]   RxID:   [id number] GABAPENTIN 300 MG CAPS (GABAPENTIN) one tid  #180 x 4   Entered and Authorized by:   Gordy Savers  MD   Signed by:   Gordy Savers  MD on 12/14/2009   Method used:   Electronically to        [id number] * (retail)       [address]       [place], [place]  [address]       Ph: [phone]       Fax: [phone]   RxID:   [id number] FLUTICASONE PROPIONATE 50 MCG/ACT  SUSP (FLUTICASONE PROPIONATE) use daily  #3 x 4   Entered and Authorized by:   Gordy Savers  MD   Signed by:   Gordy Savers  MD on 12/14/2009   Method used:   Electronically to        [id number] * (retail)       [address]       [place], [place]  [address]       Ph: [phone]       Fax: [phone]   RxID:   [id number] PROZAC 20 MG  CAPS (FLUOXETINE HCL) 1 once daily  #90 x 5   Entered and Authorized by:   Gordy Savers  MD   Signed by:   Gordy Savers  MD on 12/14/2009   Method used:   Electronically to        [id number] * (retail)       [address]       [place], [place]  [address]       Ph: [phone]       Fax: [phone]   RxID:   [id number] FLEXERIL 10 MG  TABS (CYCLOBENZAPRINE HCL) 1 three times a day as needed  #90 x 4   Entered and Authorized by:   Gordy Savers  MD   Signed by:   Gordy Savers  MD on 12/14/2009   Method used:   Electronically to        [id number] * (retail)       [address]       [place], [place]  [address]       Ph: [phone]       Fax: [phone]   RxID:   [id number] HYDROCHLOROTHIAZIDE 25 MG TABS (HYDROCHLOROTHIAZIDE) Take 1 tablet by mouth once a day  #90 x 6   Entered and Authorized by:   Gordy Savers  MD   Signed by:   Gordy Savers  MD on 12/14/2009   Method used:   Electronically to        [id number] * (retail)       [address]       [place], [place]  [address]       Ph: [phone]       Fax: [phone]   RxID:  [id number]    Orders Added: 1)  EKG w/ Interpretation [93000] 2)  Est. Patient 40-64 years [phone]

## 2010-02-09 NOTE — Assessment & Plan Note (Signed)
Summary: CPX/IF   Vital Signs:  Patient Profile:   60 Years Old Female Height:     62 inches Weight:      221 pounds BMI:     40.57 Temp:     98.2 degrees F oral BP sitting:   142 / 74  (left arm)  Vitals Entered By: Raechel Ache, RN (November 28, 2006 2:25 PM)                 Chief Complaint:  CPX, labs done. Had treadmill 2007. C/o reflux, gas, and chest and back discomfort.Marland Kitchen  History of Present Illness: 60 year old female seen today for a wellness exam and a long and a and will Current Allergies: VICODIN (HYDROCODONE-ACETAMINOPHEN)  Past Medical History:    GERD    Hyperlipidemia    Hypertension    Fibromyalgia    Mild obesity    peripheral edema    Allergic rhinitis  Past Surgical History:    Carpal tunnel release    Tonsillectomy    right knee arthroscopic surgery 2002    gravida one, para one, aborta zero        flexible sigmoidoscopy March 2002     ETT March 2007   Family History:    father history of diabetes, court-ordered disease with a history breast cancer, metastatic to the brain.  Two brothers, one sister.  Positive diabetes, hypertension        father died age 92 of aspiration pneumonia, history of diabetes, CAD, senile dementia        Mother died age 76.  Breast cancer, metastatic to the brain        Two brothers, one sister        For diabetes, hypertension    that his suicide death        Family history positive colon cancer with cousins and aunts    Review of Systems       The patient complains of peripheral edema.         complain of paresthesias involving her feet   Physical Exam  General:     overweight-appearing.   Head:     Normocephalic and atraumatic without obvious abnormalities. No apparent alopecia or balding. Eyes:     No corneal or conjunctival inflammation noted. EOMI. Perrla. Funduscopic exam benign, without hemorrhages, exudates or papilledema. Vision grossly normal. Ears:     External ear exam shows no  significant lesions or deformities.  Otoscopic examination reveals clear canals, tympanic membranes are intact bilaterally without bulging, retraction, inflammation or discharge. Hearing is grossly normal bilaterally. Nose:     External nasal examination shows no deformity or inflammation. Nasal mucosa are pink and moist without lesions or exudates. Mouth:     edentulous Neck:     No deformities, masses, or tenderness noted. Chest Wall:     No deformities, masses, or tenderness noted. Breasts:     No mass, nodules, thickening, tenderness, bulging, retraction, inflamation, nipple discharge or skin changes noted.   Lungs:     Normal respiratory effort, chest expands symmetrically. Lungs are clear to auscultation, no crackles or wheezes. Heart:     Normal rate and regular rhythm. S1 and S2 normal without gallop, murmur, click, rub or other extra sounds. Abdomen:     Bowel sounds positive,abdomen soft and non-tender without masses, organomegaly or hernias noted. Rectal:     No external abnormalities noted. Normal sphincter tone. No rectal masses or tenderness. Genitalia:     Normal  introitus for age, no external lesions, no vaginal discharge, mucosa pink and moist, no vaginal or cervical lesions, no vaginal atrophy, no friaility or hemorrhage, normal uterus size and position, no adnexal masses or tenderness Msk:     No deformity or scoliosis noted of thoracic or lumbar spine.   Pulses:     left dorsalis pedis pulse Lovie diminished Neurologic:     No cranial nerve deficits noted. Station and gait are normal. Plantar reflexes are down-going bilaterally. DTRs are symmetrical throughout. Sensory, motor and coordinative functions appear intact.  lower extremities intact to vibration, and soft touch and monofilament testing Cervical Nodes:     No lymphadenopathy noted Axillary Nodes:     No palpable lymphadenopathy Inguinal Nodes:     No significant adenopathy Psych:     Cognition and  judgment appear intact. Alert and cooperative with normal attention span and concentration. No apparent delusions, illusions, hallucinations    Impression & Recommendations:  Problem # 1:  HYPERTENSION (ICD-401.9)  Her updated medication list for this problem includes:    Hydrochlorothiazide 25 Mg Tabs (Hydrochlorothiazide) .Marland Kitchen... Take 1 tablet by mouth once a day   Problem # 2:  HYPERLIPIDEMIA (ICD-272.4)  Her updated medication list for this problem includes:    Lipitor 40 Mg Tabs (Atorvastatin calcium) .Marland Kitchen... 1 once daily   Problem # 3:  GERD (ICD-530.81) lot [id number], EXP 30 jun 09, sanofi pasteur left deltoid IM, 0.5 cc.  Her updated medication list for this problem includes:    Nexium 40 Mg Cpdr (Esomeprazole magnesium) .Marland Kitchen... Take 1 capsule by mouth twice a day   Complete Medication List: 1)  Hydrochlorothiazide 25 Mg Tabs (Hydrochlorothiazide) .... Take 1 tablet by mouth once a day 2)  Lipitor 40 Mg Tabs (Atorvastatin calcium) .Marland Kitchen.. 1 once daily 3)  Nexium 40 Mg Cpdr (Esomeprazole magnesium) .... Take 1 capsule by mouth twice a day 4)  Voltaren 75 Mg Tbec (Diclofenac sodium) .Marland Kitchen.. 1 tablet by mouth twice a day 5)  Darvocet-n 100 100-650 Mg Tabs (Propoxyphene n-apap) .Marland Kitchen.. 1 q6h as needed 6)  Flexeril 10 Mg Tabs (Cyclobenzaprine hcl) .Marland Kitchen.. 1 three times a day as needed 7)  Cymbalta 60 Mg Cpep (Duloxetine hcl) .... One daily  Other Orders: Influenza Vaccine NON MCR (56213)   Patient Instructions: 1)  Please schedule a follow-up appointment in 3 months. 2)  Limit your Sodium (Salt). 3)  It is important that you exercise regularly at least 20 minutes 5 times a week. If you develop chest pain, have severe difficulty breathing, or feel very tired , stop exercising immediately and seek medical attention. 4)  You need to lose weight. Consider a lower calorie diet and regular exercise.     Prescriptions: CYMBALTA 60 MG  CPEP (DULOXETINE HCL) one daily  #90 x 6   Entered and  Authorized by:   Gordy Savers  MD   Signed by:   Gordy Savers  MD on 11/28/2006   Method used:   Print then Give to Patient   RxID:   [id number] FLEXERIL 10 MG  TABS (CYCLOBENZAPRINE HCL) 1 three times a day as needed  #90 x 4   Entered and Authorized by:   Gordy Savers  MD   Signed by:   Gordy Savers  MD on 11/28/2006   Method used:   Print then Give to Patient   RxID:   [id number] DARVOCET-N 100 100-650 MG  TABS (PROPOXYPHENE N-APAP) 1 q6h as needed  #90  x 4   Entered and Authorized by:   Gordy Savers  MD   Signed by:   Gordy Savers  MD on 11/28/2006   Method used:   Print then Give to Patient   RxID:   [id number] VOLTAREN 75 MG TBEC (DICLOFENAC SODIUM) 1 tablet by mouth twice a day  #180 x 6   Entered and Authorized by:   Gordy Savers  MD   Signed by:   Gordy Savers  MD on 11/28/2006   Method used:   Print then Give to Patient   RxID:   [id number] NEXIUM 40 MG CPDR (ESOMEPRAZOLE MAGNESIUM) Take 1 capsule by mouth twice a day  #90 x 6   Entered and Authorized by:   Gordy Savers  MD   Signed by:   Gordy Savers  MD on 11/28/2006   Method used:   Print then Give to Patient   RxID:   [id number] LIPITOR 40 MG TABS (ATORVASTATIN CALCIUM) 1 once daily  #90 x 6   Entered and Authorized by:   Gordy Savers  MD   Signed by:   Gordy Savers  MD on 11/28/2006   Method used:   Print then Give to Patient   RxID:   [id number] HYDROCHLOROTHIAZIDE 25 MG TABS (HYDROCHLOROTHIAZIDE) Take 1 tablet by mouth once a day  #90 x 6   Entered and Authorized by:   Gordy Savers  MD   Signed by:   Gordy Savers  MD on 11/28/2006   Method used:   Print then Give to Patient   RxID:   [id number]  ]  Influenza Vaccine    Vaccine Type: Fluvax Non-MCR    Given by: Raechel Ache, RN  Flu Vaccine Consent Questions    Do you have a history of severe allergic reactions to this  vaccine? no    Any prior history of allergic reactions to egg and/or gelatin? no    Do you have a sensitivity to the preservative Thimersol? no    Do you have a past history of Guillan-Barre Syndrome? no    Do you currently have an acute febrile illness? no    Have you ever had a severe reaction to latex? no    Vaccine information given and explained to patient? yes    Are you currently pregnant? no

## 2010-02-09 NOTE — Assessment & Plan Note (Signed)
Summary: 4 MONTH FOLLOW UP/FLU SHOT/CJR----PT [facility] // RS   Vital Signs:  Patient profile:   60 year old female Weight:      213 pounds Temp:     98.0 degrees F oral BP sitting:   120 / 80  (right arm) Cuff size:   regular  Vitals Entered By: Duard Brady LPN (October 07, 2009 10:04 AM) CC: 4 mos rov - doing well Is Patient Diabetic? No Flu Vaccine Consent Questions     Do you have a history of severe allergic reactions to this vaccine? no    Any prior history of allergic reactions to egg and/or gelatin? no    Do you have a sensitivity to the preservative Thimersol? no    Do you have a past history of Guillan-Barre Syndrome? no    Do you currently have an acute febrile illness? no    Have you ever had a severe reaction to latex? no    Vaccine information given and explained to patient? yes    Are you currently pregnant? no    Lot Number:[id number]   Exp Date:07/10/2010   Site Given  Left Deltoid IM   CC:  4 mos rov - doing well.  History of Present Illness: 60 year old patient who is seen today for follow-up.  She has a history of impaired glucose tolerance, hypertension, dyslipidemia, and gastroesophageal reflux disease.  She has chronic low back pain and in July did have a procedure performed with benefit.  In general, she has done quite well.  She remains on Lipitor 80 mg daily, which she tolerates well  Allergies: 1)  ! Iodine (Iodine) 2)  ! Phenylbutazone (Phenylbutazone) 3)  ! Septra Ds (Sulfamethoxazole-Trimethoprim) 4)  ! Tetracycline Hcl (Tetracycline Hcl) 5)  ! Theophylline Cr (Theophylline) 6)  Vicodin (Hydrocodone-Acetaminophen)  Past History:  Past Medical History: Reviewed history from 11/29/2007 and no changes required. GERD Hyperlipidemia Hypertension Fibromyalgia Mild obesity peripheral edema Allergic rhinitis chronic low back pain  Past Surgical History: Carpal tunnel release Tonsillectomy right knee arthroscopic surgery 2002 gravida  one, para one, aborta zero  flexible sigmoidoscopy March 2002  ETT March 2007 nuclear  medicine stress test June 2009 lumbar laminectomy June 2009 lumbar surgery 07-2009  normal bone density November 2009  Review of Systems       The patient complains of peripheral edema.  The patient denies anorexia, fever, weight loss, weight gain, vision loss, decreased hearing, hoarseness, chest pain, syncope, dyspnea on exertion, prolonged cough, headaches, hemoptysis, abdominal pain, melena, hematochezia, severe indigestion/heartburn, hematuria, incontinence, genital sores, muscle weakness, suspicious skin lesions, transient blindness, difficulty walking, depression, unusual weight change, abnormal bleeding, enlarged lymph nodes, angioedema, and breast masses.    Physical Exam  General:  overweight-appearing.  130/80overweight-appearing.   Head:  Normocephalic and atraumatic without obvious abnormalities. No apparent alopecia or balding. Eyes:  No corneal or conjunctival inflammation noted. EOMI. Perrla. Funduscopic exam benign, without hemorrhages, exudates or papilledema. Vision grossly normal. Mouth:  Oral mucosa and oropharynx without lesions or exudates.  Teeth in good repair. Neck:  No deformities, masses, or tenderness noted. Lungs:  Normal respiratory effort, chest expands symmetrically. Lungs are clear to auscultation, no crackles or wheezes. Heart:  Normal rate and regular rhythm. S1 and S2 normal without gallop, murmur, click, rub or other extra sounds. Abdomen:  Bowel sounds positive,abdomen soft and non-tender without masses, organomegaly or hernias noted. Msk:  No deformity or scoliosis noted of thoracic or lumbar spine.   Extremities:  trace left pedal  edema and trace right pedal edema.  trace left pedal edema.   Skin:  Intact without suspicious lesions or rashes Cervical Nodes:  No lymphadenopathy noted Psych:  Cognition and judgment appear intact. Alert and cooperative with normal  attention span and concentration. No apparent delusions, illusions, hallucinations   Impression & Recommendations:  Problem # 1:  BACK PAIN (ICD-724.5)  The following medications were removed from the medication list:    Voltaren 75 Mg Tbec (Diclofenac sodium) .Marland Kitchen... 1 tablet by mouth twice a day Her updated medication list for this problem includes:    Flexeril 10 Mg Tabs (Cyclobenzaprine hcl) .Marland Kitchen... 1 three times a day as needed    Celebrex 100 Mg Caps (Celecoxib) ..... Qd  The following medications were removed from the medication list:    Voltaren 75 Mg Tbec (Diclofenac sodium) .Marland Kitchen... 1 tablet by mouth twice a day Her updated medication list for this problem includes:    Flexeril 10 Mg Tabs (Cyclobenzaprine hcl) .Marland Kitchen... 1 three times a day as needed    Celebrex 100 Mg Caps (Celecoxib) ..... Qd  Problem # 2:  HYPERTENSION (ICD-401.9)  Her updated medication list for this problem includes:    Hydrochlorothiazide 25 Mg Tabs (Hydrochlorothiazide) .Marland Kitchen... Take 1 tablet by mouth once a day  Her updated medication list for this problem includes:    Hydrochlorothiazide 25 Mg Tabs (Hydrochlorothiazide) .Marland Kitchen... Take 1 tablet by mouth once a day  Problem # 3:  HYPERLIPIDEMIA (ICD-272.4)  Her updated medication list for this problem includes:    Lipitor 80 Mg Tabs (Atorvastatin calcium) ..... One daily  Her updated medication list for this problem includes:    Lipitor 80 Mg Tabs (Atorvastatin calcium) ..... One daily  Complete Medication List: 1)  Hydrochlorothiazide 25 Mg Tabs (Hydrochlorothiazide) .... Take 1 tablet by mouth once a day 2)  Flexeril 10 Mg Tabs (Cyclobenzaprine hcl) .Marland Kitchen.. 1 three times a day as needed 3)  Prozac 20 Mg Caps (Fluoxetine hcl) .Marland Kitchen.. 1 once daily 4)  Fluticasone Propionate 50 Mcg/act Susp (Fluticasone propionate) .... Use daily 5)  Fexofenadine Hcl 180 Mg Tabs (Fexofenadine hcl) .... One daily, as needed 6)  Gabapentin 300 Mg Caps (Gabapentin) .... One tid 7)   Ranitidine Hcl 150 Mg Tabs (Ranitidine hcl) .... One twice daily 8)  Lipitor 80 Mg Tabs (Atorvastatin calcium) .... One daily 9)  Lidoderm 5 % Ptch (Lidocaine) .Marland Kitchen.. 1-3 daily--topical 10)  Celebrex 100 Mg Caps (Celecoxib) .... Qd 11)  Vitamin D 1000 Unit Tabs (Cholecalciferol) .... Qd 12)  Calcium 1500 Mg Tabs (Calcium carbonate) .... Qd  Other Orders: Flu Vaccine 71yrs + MEDICARE PATIENTS (Z6109) Administration Flu vaccine - MCR (U0454)  Patient Instructions: 1)  Please schedule a follow-up appointment in 3 months for annual exam 2)  Limit your Sodium (Salt). 3)  It is important that you exercise regularly at least 20 minutes 5 times a week. If you develop chest pain, have severe difficulty breathing, or feel very tired , stop exercising immediately and seek medical attention. 4)  You need to lose weight. Consider a lower calorie diet and regular exercise.  5)  Take calcium +Vitamin D daily. Prescriptions: LIPITOR 80 MG TABS (ATORVASTATIN CALCIUM) one daily  #30 x 6   Entered and Authorized by:   Gordy Savers  MD   Signed by:   Gordy Savers  MD on 10/07/2009   Method used:   Electronically to        [facility]  #  [id number] * (retail)       [address]       [place], [place]  [address]       Ph: [phone]       Fax: [phone]   RxID:   [id number] RANITIDINE HCL 150 MG TABS (RANITIDINE HCL) one twice daily  #180 x 6   Entered and Authorized by:   Gordy Savers  MD   Signed by:   Gordy Savers  MD on 10/07/2009   Method used:   Electronically to        [id number] * (retail)       [address]       [place], [place]  [address]       Ph: [phone]       Fax: [phone]   RxID:   [id number] GABAPENTIN 300 MG CAPS (GABAPENTIN) one tid  #180 x 4   Entered and Authorized by:   Gordy Savers  MD   Signed by:   Gordy Savers  MD on 10/07/2009   Method used:   Electronically to        [id number] * (retail)        [address]       [place], [place]  [address]       Ph: [phone]       Fax: [phone]   RxID:   [id number] FEXOFENADINE HCL 180 MG  TABS (FEXOFENADINE HCL) one daily, as needed  #90 x 4   Entered and Authorized by:   Gordy Savers  MD   Signed by:   Gordy Savers  MD on 10/07/2009   Method used:   Electronically to        [id number] * (retail)       [address]       [place], [place]  [address]       Ph: [phone]       Fax: [phone]   RxID:   [id number] FLUTICASONE PROPIONATE 50 MCG/ACT  SUSP (FLUTICASONE PROPIONATE) use daily  #3 x 4   Entered and Authorized by:   Gordy Savers  MD   Signed by:   Gordy Savers  MD on 10/07/2009   Method used:   Electronically to        [id number] * (retail)       [address]       [place], [place]  [address]       Ph: [phone]       Fax: [phone]   RxID:   [id number] PROZAC 20 MG  CAPS (FLUOXETINE HCL) 1 once daily  #90 x 5   Entered and Authorized by:   Gordy Savers  MD   Signed by:   Gordy Savers  MD on 10/07/2009   Method used:   Electronically to        [id number] * (retail)       [address]       [place], [place]  [address]       Ph: [phone]       Fax: [phone]   RxID:   [id number] FLEXERIL 10 MG  TABS (CYCLOBENZAPRINE HCL) 1 three times a day as needed  #90 x 4   Entered and Authorized by:   Gordy Savers  MD   Signed by:   Gordy Savers  MD on 10/07/2009   Method used:   Electronically to        [id number] * (retail)       [address]       [place], [place]  [address]       Ph: [phone]       Fax: [phone]   RxID:   [id number] HYDROCHLOROTHIAZIDE 25 MG TABS (HYDROCHLOROTHIAZIDE) Take 1 tablet by mouth once a day  #90 x 6   Entered and Authorized by:   Gordy Savers  MD   Signed by:   Gordy Savers  MD on 10/07/2009   Method used:   Electronically to        [id number] * (retail)       [address]       [place], [place]  [address]       Ph: [phone]       Fax: [phone]   RxID:   [id number]

## 2010-03-03 ENCOUNTER — Encounter: Payer: Self-pay | Admitting: Internal Medicine

## 2010-03-04 ENCOUNTER — Encounter: Payer: Self-pay | Admitting: Internal Medicine

## 2010-03-04 ENCOUNTER — Ambulatory Visit (INDEPENDENT_AMBULATORY_CARE_PROVIDER_SITE_OTHER): Payer: Medicare Other | Admitting: Internal Medicine

## 2010-03-04 VITALS — BP 118/80 | Temp 98.0°F | Wt 206.0 lb

## 2010-03-04 DIAGNOSIS — I10 Essential (primary) hypertension: Secondary | ICD-10-CM

## 2010-03-04 DIAGNOSIS — R350 Frequency of micturition: Secondary | ICD-10-CM

## 2010-03-04 LAB — POCT URINALYSIS DIPSTICK
Bilirubin, UA: NEGATIVE
Ketones, UA: NEGATIVE
Protein, UA: NEGATIVE
Spec Grav, UA: 1.01
pH, UA: 7.5

## 2010-03-04 MED ORDER — CIPROFLOXACIN HCL 500 MG PO TABS: 500.0000 mg | ORAL_TABLET | Freq: Two times a day (BID) | ORAL | Status: DC

## 2010-03-04 MED ORDER — CIPROFLOXACIN HCL 500 MG PO TABS: 500.0000 mg | ORAL_TABLET | Freq: Two times a day (BID) | ORAL | Status: AC

## 2010-03-04 NOTE — Progress Notes (Signed)
  Subjective:    Patient ID: Erin Williams, female    DOB: 09-23-1950, 60 y.o.   MRN: [id number]  HPI   60 year old patient with a several day history of lower abdominal discomfort described as a crampy sensation. She's also noticed some frequent urination urgency but no real dysuria. Denies any fever or flank pain. She has had rare UTIs in the past.  She has a history of hypertension which has been controlled on diuretic therapy. This remained stable.   Review of Systems  Constitutional: Negative.   Genitourinary: Positive for urgency, frequency, hematuria and difficulty urinating. Negative for dysuria and flank pain.       Objective:   Physical Exam  Constitutional: She is oriented to person, place, and time. She appears well-developed and well-nourished.  HENT:  Head: Normocephalic.  Right Ear: External ear normal.  Left Ear: External ear normal.  Mouth/Throat: Oropharynx is clear and moist.  Eyes: Conjunctivae and EOM are normal. Pupils are equal, round, and reactive to light.  Neck: Normal range of motion. Neck supple. No thyromegaly present.  Cardiovascular: Normal rate, regular rhythm and normal heart sounds.         The left dorsalis pedis pulse. Decreased  Pulmonary/Chest: Effort normal and breath sounds normal.  Abdominal: Soft. Bowel sounds are normal. She exhibits no mass. There is no tenderness.  Musculoskeletal: Normal range of motion.  Lymphadenopathy:    She has no cervical adenopathy.  Neurological: She is alert and oriented to person, place, and time.  Skin: Skin is warm and dry. No rash noted.  Psychiatric: She has a normal mood and affect. Her behavior is normal.          Assessment & Plan:   patient has urinary frequency and some urgency as well as crampy abdominal pain. Urinalysis reveals trace hematuria and pyuria. We'll treat for suspected low-grade UTI with Cipro for 5 days  Hypertension stable

## 2010-03-04 NOTE — Patient Instructions (Signed)
Take your antibiotic as prescribed until ALL of it is gone, but stop if you develop a rash, swelling, or any side effects of the medication.  Contact our office as soon as possible if  there are side effects of the medication.  Limit your sodium (Salt) intake  Please check your blood pressure on a regular basis.  If it is consistently greater than 150/90, please make an office appointment.   Return in 6 months for follow-up

## 2010-03-17 IMAGING — RF DG LUMBAR SPINE 2-3V
1 series · 2 of 2 positions shown · non-contrast
Comparison: MRI 03/27/2007

CLINICAL DATA: L3-L4 and L4-L5 P L I F.

LUMBAR SPINE - 2-3 VIEW

[Series 1: run · 2 of 2 slices shown]
[im 1/2]
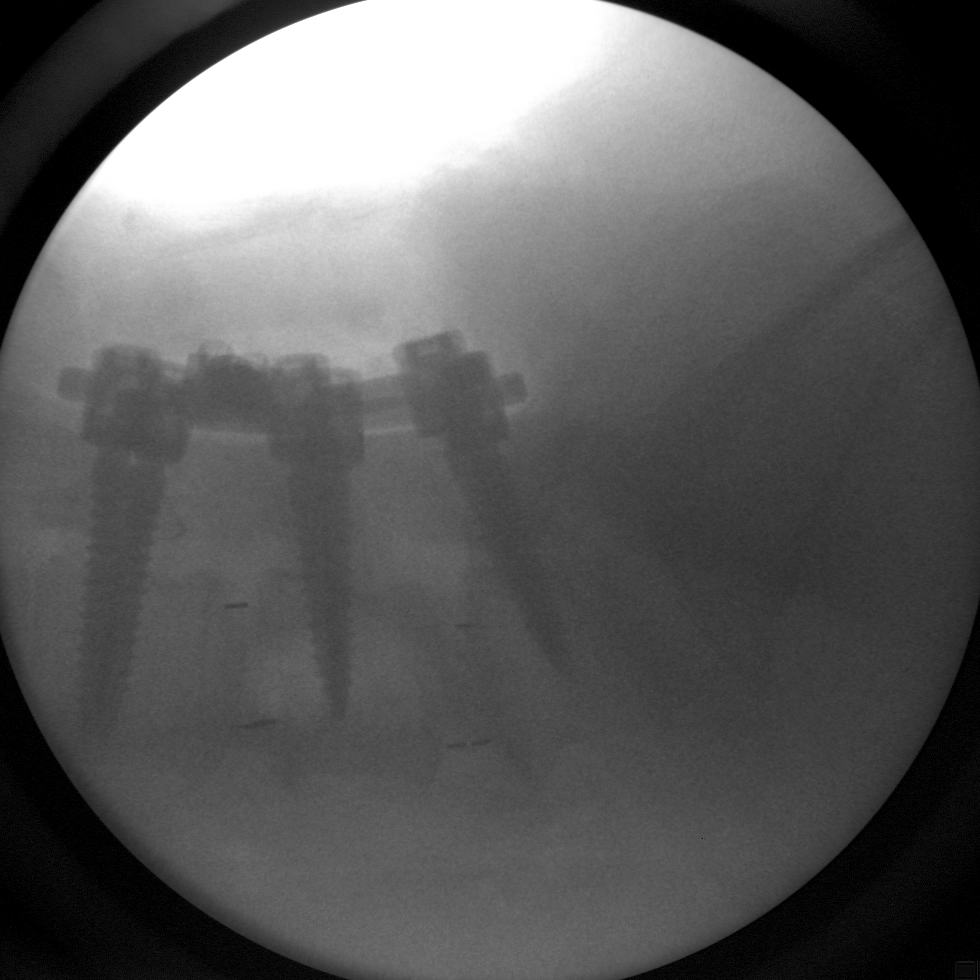
[im 2/2]
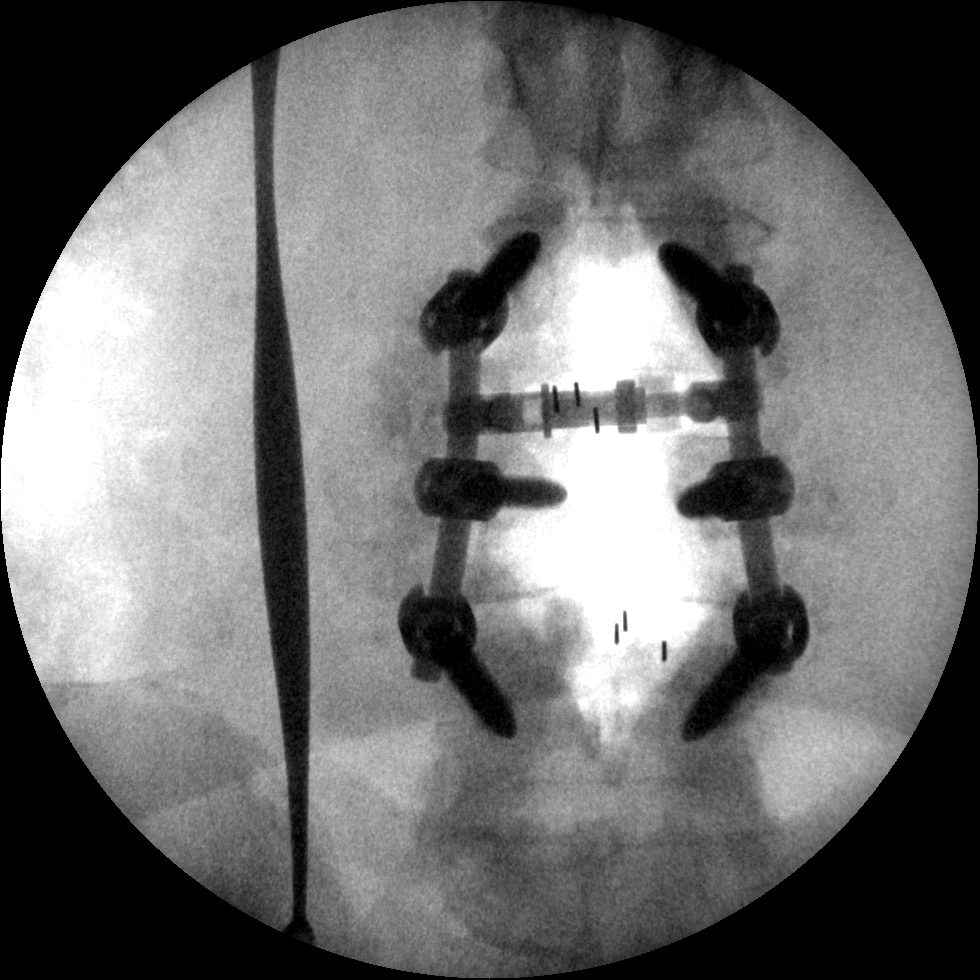

[2 of 2 positions shown; findings below may reference images not displayed]

FINDINGS: AP and lateral intraoperative views.  The patient is
status post posterior fixation at L3-L5.  The L5 level is under
penetrated due to technique.  There is no gross hardware
complication identified.  Interbody fusion material is centrally
positioned.
IMPRESSION: L3-L5 posterior fixation.  Mildly limited evaluation of the
lumbosacral junction.

## 2010-04-07 ENCOUNTER — Encounter: Payer: Self-pay | Admitting: Internal Medicine

## 2010-04-08 ENCOUNTER — Encounter: Payer: Self-pay | Admitting: Internal Medicine

## 2010-04-08 ENCOUNTER — Ambulatory Visit (INDEPENDENT_AMBULATORY_CARE_PROVIDER_SITE_OTHER): Payer: Medicare Other | Admitting: Internal Medicine

## 2010-04-08 VITALS — BP 128/80 | Temp 98.5°F | Wt 208.0 lb

## 2010-04-08 DIAGNOSIS — R109 Unspecified abdominal pain: Secondary | ICD-10-CM

## 2010-04-08 DIAGNOSIS — E785 Hyperlipidemia, unspecified: Secondary | ICD-10-CM

## 2010-04-08 DIAGNOSIS — E739 Lactose intolerance, unspecified: Secondary | ICD-10-CM

## 2010-04-08 DIAGNOSIS — I10 Essential (primary) hypertension: Secondary | ICD-10-CM

## 2010-04-08 DIAGNOSIS — R300 Dysuria: Secondary | ICD-10-CM

## 2010-04-08 LAB — POCT URINALYSIS DIPSTICK
Bilirubin, UA: NEGATIVE
Blood, UA: NEGATIVE
Glucose, UA: NEGATIVE
Ketones, UA: NEGATIVE
pH, UA: 6

## 2010-04-08 MED ORDER — HYDROCHLOROTHIAZIDE 25 MG PO TABS: 25.0000 mg | ORAL_TABLET | Freq: Every day | ORAL | Status: DC

## 2010-04-08 MED ORDER — CELECOXIB 100 MG PO CAPS: 100.0000 mg | ORAL_CAPSULE | Freq: Every day | ORAL | Status: DC

## 2010-04-08 MED ORDER — ATORVASTATIN CALCIUM 80 MG PO TABS: 80.0000 mg | ORAL_TABLET | Freq: Every day | ORAL | Status: DC

## 2010-04-08 MED ORDER — LIDOCAINE 5 % EX PTCH: 1.0000 | MEDICATED_PATCH | Freq: Two times a day (BID) | CUTANEOUS | Status: DC

## 2010-04-08 MED ORDER — FLUOXETINE HCL 20 MG PO CAPS: 20.0000 mg | ORAL_CAPSULE | Freq: Every day | ORAL | Status: DC

## 2010-04-08 MED ORDER — RANITIDINE HCL 150 MG PO TABS: 150.0000 mg | ORAL_TABLET | Freq: Two times a day (BID) | ORAL | Status: AC

## 2010-04-08 MED ORDER — GABAPENTIN 300 MG PO CAPS: 300.0000 mg | ORAL_CAPSULE | Freq: Three times a day (TID) | ORAL | Status: DC

## 2010-04-08 MED ORDER — FLUTICASONE PROPIONATE 50 MCG/ACT NA SUSP: 1.0000 | Freq: Every day | NASAL | Status: DC

## 2010-04-08 MED ORDER — CYCLOBENZAPRINE HCL 10 MG PO TABS: 10.0000 mg | ORAL_TABLET | Freq: Three times a day (TID) | ORAL | Status: DC | PRN

## 2010-04-08 MED ORDER — FEXOFENADINE HCL 180 MG PO TABS: 180.0000 mg | ORAL_TABLET | Freq: Every day | ORAL | Status: AC | PRN

## 2010-04-08 NOTE — Progress Notes (Signed)
  Subjective:    Patient ID: Erin Williams, female    DOB: 1951/01/09, 60 y.o.   MRN: [id number]  HPI  60 year old patient who presents with a one-month history of intermittent lower abdominal pain. No change in her bowel habits. Pain is sporadic. She was treated there earlier for a UTI but denies any dysuria she did have a rash secondary to Cipro therapy. She did have a normal clinical exam in December of last year. She has had a screening sigmoidoscopy in the past but no colonoscopy. There's been no weight loss melena hematochezia.    Review of Systems  Constitutional: Negative.   HENT: Negative for hearing loss, congestion, sore throat, rhinorrhea, dental problem, sinus pressure and tinnitus.   Eyes: Negative for pain, discharge and visual disturbance.  Respiratory: Negative for cough and shortness of breath.   Cardiovascular: Positive for leg swelling. Negative for chest pain and palpitations.  Gastrointestinal: Positive for abdominal pain. Negative for nausea, vomiting, diarrhea, constipation, blood in stool and abdominal distention.  Genitourinary: Negative for dysuria, urgency, frequency, hematuria, flank pain, vaginal bleeding, vaginal discharge, difficulty urinating, vaginal pain and pelvic pain.  Musculoskeletal: Negative for joint swelling, arthralgias and gait problem.  Skin: Negative for rash.  Neurological: Negative for dizziness, syncope, speech difficulty, weakness, numbness and headaches.  Hematological: Negative for adenopathy.  Psychiatric/Behavioral: Negative for behavioral problems, dysphoric mood and agitation. The patient is not nervous/anxious.        Objective:   Physical Exam  Constitutional: She is oriented to person, place, and time. She appears well-developed and well-nourished.  HENT:  Head: Normocephalic.  Right Ear: External ear normal.  Left Ear: External ear normal.  Mouth/Throat: Oropharynx is clear and moist.  Eyes: Conjunctivae and EOM are  normal. Pupils are equal, round, and reactive to light.  Neck: Normal range of motion. Neck supple. No thyromegaly present.  Cardiovascular: Normal rate, regular rhythm, normal heart sounds and intact distal pulses.   Pulmonary/Chest: Effort normal and breath sounds normal.  Abdominal: Soft. Bowel sounds are normal. She exhibits no mass. There is no tenderness. There is no rebound and no guarding.  Musculoskeletal: Normal range of motion.  Lymphadenopathy:    She has no cervical adenopathy.  Neurological: She is alert and oriented to person, place, and time.  Skin: Skin is warm and dry. No rash noted.  Psychiatric: She has a normal mood and affect. Her behavior is normal.          Assessment & Plan:  Intermittent nonspecific abdominal pain. We'll set up for a full colonoscopy if this is negative and abdominal pain persists will consider GYN referral or possibly pelvic ultrasound Hypertension stable Dyslipidemia. Status post UTI

## 2010-04-08 NOTE — Patient Instructions (Signed)
High-fiber diet Limit your sodium (Salt) intake    It is important that you exercise regularly, at least 20 minutes 3 to 4 times per week.  If you develop chest pain or shortness of breath seek  medical attention.   colonoscopy

## 2010-05-19 ENCOUNTER — Ambulatory Visit: Payer: Medicare Other | Admitting: Gastroenterology

## 2010-05-25 NOTE — Op Note (Signed)
NAMEARIANY, KESSELMAN            ACCOUNT NO.:  [id number]   MEDICAL RECORD NO.:  [id number]          PATIENT TYPE:  INP   LOCATION:  3001                         FACILITY:  [facility]   PHYSICIAN:  Kathaleen Maser. Pool, M.D.    DATE OF BIRTH:  July 15, 1950   DATE OF PROCEDURE:  06/11/2007  DATE OF DISCHARGE:                               OPERATIVE REPORT   PREOPERATIVE DIAGNOSES:  1. 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  2. L4-L5 degenerative spondylolisthesis/herniated nucleus pulposus      with marked stenosis.   POSTOPERATIVE DIAGNOSES:  1. 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  2. L4-L5 degenerative spondylolisthesis/herniated nucleus pulposus      with marked stenosis.   PROCEDURE:  Bilateral L3-L4 and L4-L5 decompressive laminectomy with  microdiskectomies and L3, L4, and L5 foraminotomies, more than would be  required for simple interbody fusion alone.  L3-L4 and L4-L5 posterior  lumbar fusion body utilizing tangent interbody allograft wedge, Telamon  interbody PEEK cage, and local autografting.  L3-L5 posterolateral  arthrodesis utilizing segmental pedicle screw sedation and local  autografting.   SURGEON:  Kathaleen Maser. Pool, MD   ASSISTANT:  Donalee Citrin, MD   ANESTHESIA:  General endotracheal.   INDICATIONS:  Ms. Mabry is a 60 year old female with history of severe  back bilateral lower extremity pain failing conservative management.  Workup demonstrates evidence of critical stenosis at L3-L4 and L4-L5  secondary to a variety of factors including disk herniation at both  levels, early degenerative spondylolisthesis, and facet arthropathy both  levels.  The patient has been counseled as to her options.  She decided  proceed with 2-level lumbar decompression and fusion with  instrumentation.   OPERATION IN DETAIL:  The patient was  brought to the operating room and  placed on the operating table in supine position.  After an adequate  level of anesthesia was achieved, the patient was placed prone on the  Wilson frame.  Appropriately padded the patient's lumbar region, prepped  and draped sterilely.  A 10 blade was used to make a curvilinear skin  incision overlying the L2, L3, L4, and L5 levels.  This was carried down  sharply in the midline.  Subperiosteal dissection was then performed  exposing the lamina and facet joints at L2, L3, L4, and L5 as well as  transverse processes of L3, L4, and L5.  Deep self-retaining retractor  was placed.  Intraoperative fluoroscopy was used and levels were  confirmed.  Complete decompressive laminectomy was then performed at L3  and L4 using Leksell rongeurs, Kerrison rongeurs, and high-speed drill  to remove the entire lamina of L3, the entire lamina of L4, and the  superior aspect and lamina of L5.  Complete inferior facetectomies of L3  and L4 were performed bilaterally.  Superior facetectomies of L4 and L5  were performed bilaterally.  Ligamentum flavum was then elevated and  resected in usual fashion using Kerrison rongeurs.  All bone was cleaned  and used in later autografting.  Wide decompressive foraminotomies were  then performed along the course exiting L3, L4, and L5 nerve roots  starting first at the L3-L4 level.  Epidural venous plexus was  coagulated and cut.  Starting on the right side, the thecal sac and  nerve roots were retracted on the right side.  Disk space was then  incised with 15 blade in a rectangular fashion.  Wide disk space clean-  out was achieved using pituitary rongeurs, upward angled pituitary  rongeurs, and Epstein curettes.  All elements of the disk herniation  were completely resected.  At this point, the procedure was then  repeated on the contralateral side.  Disk space was then sequentially  dilated up to 8 mm with an 8 mm distractor left in  the patient's right  side.  Thecal sac and nerve roots were retracted on the left side.  Disk  space was then reamed and then cut with 8 mm tangent instrument.  Soft  tissues were then removed from the interspace.  An 8 x 26 mm tangent  wedge was then impacted into place and recessed roughly 2 mm in the  posterior cortical margin of L3.  Distractors were removed from the  patient's right side.  Thecal sac and nerve roots were protected on the  right side.  Disk space once again reamed and then cut with 8 mm tangent  instruments.  Soft tissues were then removed using pituitary rongeurs.  Disk space was further curettaged.  Morselized autograft was then packed  in the interspace.  An 8 x 22 mm Telamon cage was then impacted into  place and recessed roughly 2 mm in the posterior cortical margin of L3.  The procedure then repeated at L4-L5, again without complication.  Pedicles of L3, L4, and L5 were then identified using surface landmarks  and intraoperative fluoroscopy.  Superficial bone around the pedicle was  then removed using the high-speed drill.  Each pedicle was then probed  using pedicle awl.  Each awl track was then tapped with 5.25 screw  tapper.  Each screw hole was probed and found be solid bone.  A 6.75 x  45 mm radius screws were placed bilaterally at L3, 6.75 x 40 mm screw  was placed on the left at L4, and on the right at L4 a 5.75 x 40 mm  screw was placed.  A 6.75 x 40 mm screw was placed bilaterally at L5.  Transverse processes were then decorticated using high speed drill.  Morselized autograft mixed with Progenix putty was then packed  posterolaterally.  The short segment titanium rods were then contoured  and placed over the screw heads from L3 to L5.  Locking caps were placed  over the screw heads.  Locking caps were then engaged with construct  under compression.  Final images revealed good position of bone grafts  and hardware at appropriate level with normal  alignment of spine.  Transverse connector was placed.  A medium Hemovac drain was left in the  interspace.  Wound was then irrigated one final time and then closed in  typical fashion.  Steri-Strips and sterile dressings were applied.  There were no complications.  The patient tolerated the procedure well  and she returned to the recovery room postoperatively.           ______________________________  Kathaleen Maser Pool, M.D.     HAP/MEDQ  D:  06/11/2007  T:  06/12/2007  Job:  [id number]

## 2010-05-28 NOTE — Assessment & Plan Note (Signed)
[facility] OFFICE NOTE   Erin Williams, Erin Williams                   MRN:          [id number]  DATE:11/21/2005                            DOB:          09/10/1950    A 60 year old female seen today for a wellness exam.  She has a history  of hypertension, fibromyalgia, hypercholesterolemia, gastroesophageal  reflux disease.   FAMILY HISTORY:  Remarkable for colon cancer in second degree relatives.  Mother had breast cancer.  Positive for hypertension, diabetes, and  coronary artery disease.   EXAM:  An overweight white female in no acute distress.  Blood pressure 130/80.  FUNDI, EARS, NOSE, AND THROAT:  Clear.  CHEST:  Clear.  CARDIOVASCULAR:  Normal heart sounds.  No murmurs.  BREASTS:  Negative.  ABDOMEN:  Obese, soft, and nontender.  No organomegaly.  EXTREMITIES:  The left dorsalis pedis pulse is diminished.  There is  trace edema.  NEURO:  Negative.  Intact monofilament testing.   IMPRESSION:  1. Hypertension.  2. Hyperlipidemia.  3. Mild obesity.  4. Gastroesophageal reflux disease.  5. Family history of colon cancer.  6. Fibromyalgia.   DISPOSITION:  She wishes to try Cymbalta.  Complaints are mainly leg and  feet discomfort.  We will try an initial dose of 30 mg and increase to  60 mg, and recheck in 6 weeks.  Medical regimen otherwise unchanged.     Gordy Savers, MD  Electronically Signed    PFK/MedQ  DD: 11/21/2005  DT: 11/21/2005  Job #: [id number]

## 2010-06-14 ENCOUNTER — Ambulatory Visit: Payer: Self-pay | Admitting: Internal Medicine

## 2010-07-19 ENCOUNTER — Ambulatory Visit (INDEPENDENT_AMBULATORY_CARE_PROVIDER_SITE_OTHER): Payer: Medicare Other | Admitting: Gastroenterology

## 2010-07-19 ENCOUNTER — Encounter: Payer: Self-pay | Admitting: Gastroenterology

## 2010-07-19 VITALS — BP 130/80 | HR 68 | Ht 62.0 in | Wt 204.4 lb

## 2010-07-19 DIAGNOSIS — K219 Gastro-esophageal reflux disease without esophagitis: Secondary | ICD-10-CM

## 2010-07-19 DIAGNOSIS — R109 Unspecified abdominal pain: Secondary | ICD-10-CM

## 2010-07-19 DIAGNOSIS — R197 Diarrhea, unspecified: Secondary | ICD-10-CM

## 2010-07-19 MED ORDER — PEG-KCL-NACL-NASULF-NA ASC-C 100 G PO SOLR: 1.0000 | Freq: Once | ORAL | Status: DC

## 2010-07-19 NOTE — Progress Notes (Signed)
History of Present Illness: This is a 60 year old female who relates a 7 month history of watery diarrhea and crampy lower, pain. She states she was diagnosed with a urinary tract infection in January and since that time she has noted the above GI symptoms. She states she generally had to movements in the past. She does not note any specific foods that change her symptoms except for corn increases her stool frequency. She notes no melena or hematochezia. She underwent flexible sigmoidoscopy in 2002 that was normal. Her paternal grandfather had colon cancer in his 51s. She has aunts and uncles with colon polyps. She has GERD that is well controlled with dietary measures and ranitidine. She denies dysphagia, nausea, vomiting, melena, hematochezia, fevers, chills, chest pain  Past Medical History  Diagnosis Date  . ALLERGIC RHINITIS 11/28/2006  . BACK PAIN WITH RADICULOPATHY 02/26/2007  . BENIGN NEOPLASM SKIN OTHER&UNSPEC PARTS FACE 07/18/2008  . GERD 07/10/2006  . HYPERLIPIDEMIA 07/10/2006  . HYPERTENSION 07/10/2006  . IMPAIRED GLUCOSE TOLERANCE 04/02/2008  . PRURITUS 09/28/2007  . UNSPECIFIED ANEMIA 04/02/2008  . Fibromyalgia   . Obesity   . DJD (degenerative joint disease)    Past Surgical History  Procedure Date  . Carpal tunnel release   . Tonsillectomy   . Knee arthroscopy   . Lumbar laminectomy 6/ 1/ 2009    reports that she has never smoked. She has never used smokeless tobacco. She reports that she does not drink alcohol or use illicit drugs. family history includes Breast cancer in her mother; Colon cancer in her cousin, maternal aunt, paternal grandfather, and paternal uncle; Coronary artery disease in her father; Dementia in her father; Diabetes in her brother and fathers; Heart disease in her paternal grandfather; Heart failure in her son; and Pneumonia in her father. Allergies  Allergen Reactions  . Hydrocodone-Acetaminophen     REACTION: unspecified  . Iodine     REACTION: itch    . Phenylbutazone     REACTION: itch  . Sulfamethoxazole W/Trimethoprim     REACTION: itching \\T \ hives  . Tetracycline Hcl     REACTION: itch  . Theophylline   . Ciprofloxacin Rash   Outpatient Encounter Prescriptions as of 07/19/2010  Medication Sig Dispense Refill  . atorvastatin (LIPITOR) 80 MG tablet Take 1 tablet (80 mg total) by mouth daily.  30 tablet  11  . Calcium 1500 MG tablet Take 1,500 mg by mouth daily.        . celecoxib (CELEBREX) 100 MG capsule Take 1 capsule (100 mg total) by mouth daily.  30 capsule  11  . Cholecalciferol (VITAMIN D) 1000 UNITS capsule Take 1,000 Units by mouth daily.        . cyclobenzaprine (FLEXERIL) 10 MG tablet Take 1 tablet (10 mg total) by mouth 3 (three) times daily as needed.  30 tablet  2  . fexofenadine (ALLEGRA) 180 MG tablet Take 1 tablet (180 mg total) by mouth daily as needed.  30 tablet  11  . FLUoxetine (PROZAC) 20 MG capsule Take 1 capsule (20 mg total) by mouth daily.  30 capsule  11  . fluticasone (FLONASE) 50 MCG/ACT nasal spray 1 spray by Nasal route daily.  16 g  6  . gabapentin (NEURONTIN) 300 MG capsule Take 1 capsule (300 mg total) by mouth 3 (three) times daily.  90 capsule  11  . hydrochlorothiazide 25 MG tablet Take 1 tablet (25 mg total) by mouth daily.  30 tablet  11  . lidocaine (LIDODERM)  5 % Place 1 patch onto the skin every 12 (twelve) hours. Remove & Discard patch within 12 hours or as directed by MD  30 patch  11  . ranitidine (ZANTAC) 150 MG tablet Take 1 tablet (150 mg total) by mouth 2 (two) times daily.  60 tablet  11  . peg 3350 powder (MOVIPREP) 100 G SOLR Take 1 kit (100 g total) by mouth once.  1 kit  0   Review of Systems: Arthritis, back pain, fatigue, muscle pain, sleeping problems, swelling of feet. Pertinent positive and negative review of systems were noted in the above HPI section. All other review of systems were otherwise negative.  Physical Exam: General: Well developed , well nourished, no acute  distress Head: Normocephalic and atraumatic Eyes:  sclerae anicteric, EOMI Ears: Normal auditory acuity Mouth: No deformity or lesions Neck: Supple, no masses or thyromegaly Lungs: Clear throughout to auscultation Heart: Regular rate and rhythm; no murmurs, rubs or bruits Abdomen: Soft, non tender and non distended. No masses, hepatosplenomegaly or hernias noted. Normal Bowel sounds Rectal: Deferred to colonoscopy Musculoskeletal: Symmetrical with no gross deformities  Skin: No lesions on visible extremities Pulses:  Normal pulses noted Extremities: No clubbing, cyanosis, edema or deformities noted Neurological: Alert oriented x 4, grossly nonfocal Cervical Nodes:  No significant cervical adenopathy Inguinal Nodes: No significant inguinal adenopathy Psychological:  Alert and cooperative. Normal mood and affect  Assessment and Recommendations:  1. Diarrhea, crampy lower abdominal pain and change in bowel habits. Rule out colorectal neoplasms, inflammatory bowel disease, irritable bowel syndrome and other disorders. Trial of avoiding high fat foods and milk products. Schedule colonoscopy. The risks, benefits, and alternatives to colonoscopy with possible biopsy and possible polypectomy were discussed with the patient and they consent to proceed. If her diarrhea persists to evaluate for celiac disease and other causes.  2. GERD. Continue standard antireflux measures and ranitidine 150 mg twice daily.

## 2010-07-19 NOTE — Patient Instructions (Signed)
You have been scheduled for a Colonoscopy. Separate instructions given. Pick up your prep from your pharmacy.  cc: Eleonore Chiquito, MD

## 2010-07-21 ENCOUNTER — Telehealth: Payer: Self-pay | Admitting: Gastroenterology

## 2010-07-21 NOTE — Telephone Encounter (Signed)
Answered questions regarding preperation for Colonoscopy.

## 2010-08-27 ENCOUNTER — Encounter: Payer: Self-pay | Admitting: Gastroenterology

## 2010-08-27 ENCOUNTER — Ambulatory Visit (AMBULATORY_SURGERY_CENTER): Payer: Medicare Other | Admitting: Gastroenterology

## 2010-08-27 DIAGNOSIS — R197 Diarrhea, unspecified: Secondary | ICD-10-CM

## 2010-08-27 DIAGNOSIS — R198 Other specified symptoms and signs involving the digestive system and abdomen: Secondary | ICD-10-CM

## 2010-08-27 DIAGNOSIS — K219 Gastro-esophageal reflux disease without esophagitis: Secondary | ICD-10-CM

## 2010-08-27 DIAGNOSIS — R109 Unspecified abdominal pain: Secondary | ICD-10-CM

## 2010-08-27 MED ORDER — SODIUM CHLORIDE 0.9 % IV SOLN: 500.0000 mL | INTRAVENOUS | Status: DC

## 2010-08-27 NOTE — Patient Instructions (Signed)
Discharge instructions given with verbal understanding. Normal examination. Resume previous medications. 

## 2010-08-30 ENCOUNTER — Telehealth: Payer: Self-pay

## 2010-08-30 NOTE — Telephone Encounter (Signed)

## 2010-09-03 ENCOUNTER — Ambulatory Visit: Payer: Medicare Other | Admitting: Internal Medicine

## 2010-10-06 LAB — CBC
MCHC: 33.8
MCV: 86.5
Platelets: 311
RBC: 4.16
RDW: 15.2

## 2010-10-06 LAB — BASIC METABOLIC PANEL
BUN: 9
CO2: 28
Glucose, Bld: 95
Potassium: 3.8
Sodium: 140

## 2010-10-06 LAB — TYPE AND SCREEN
ABO/RH(D): O POS
Antibody Screen: NEGATIVE

## 2010-11-18 ENCOUNTER — Ambulatory Visit (INDEPENDENT_AMBULATORY_CARE_PROVIDER_SITE_OTHER): Payer: BC Managed Care – PPO | Admitting: Internal Medicine

## 2010-11-18 ENCOUNTER — Encounter: Payer: Self-pay | Admitting: Internal Medicine

## 2010-11-18 VITALS — BP 120/80 | Temp 98.3°F | Wt 209.0 lb

## 2010-11-18 DIAGNOSIS — Z0000 Encounter for general adult medical examination without abnormal findings: Secondary | ICD-10-CM

## 2010-11-18 DIAGNOSIS — I10 Essential (primary) hypertension: Secondary | ICD-10-CM

## 2010-11-18 DIAGNOSIS — E739 Lactose intolerance, unspecified: Secondary | ICD-10-CM

## 2010-11-18 DIAGNOSIS — Z23 Encounter for immunization: Secondary | ICD-10-CM

## 2010-11-18 DIAGNOSIS — IMO0002 Reserved for concepts with insufficient information to code with codable children: Secondary | ICD-10-CM

## 2010-11-18 MED ORDER — FUROSEMIDE 40 MG PO TABS: ORAL_TABLET | ORAL | Status: DC

## 2010-11-18 NOTE — Patient Instructions (Signed)
Limit your sodium (Salt) intake  Return in 3 months for follow-up   

## 2010-11-18 NOTE — Progress Notes (Signed)
  Subjective:    Patient ID: Erin Williams, female    DOB: 07-28-1950, 60 y.o.   MRN: [id number]  HPI 60 year old patient who is seen today for followup. She has a history of osteoarthritis and is status post right knee replacement surgery in 2001 she is scheduled for orthopedic followup later this month. She's had some increasing left knee pain and popping. She did have an x-ray done at urgent care and she was told she had significant left knee arthritis. Her chief complaint today is lower extremity swelling that is intermittent. This has been an occasional problem in the past. He has osteoarthritis chronic low back pain. Additionally she has dyslipidemia and mild hypertension   Review of Systems  Constitutional: Negative.   HENT: Negative for hearing loss, congestion, sore throat, rhinorrhea, dental problem, sinus pressure and tinnitus.   Eyes: Negative for pain, discharge and visual disturbance.  Respiratory: Negative for cough and shortness of breath.   Cardiovascular: Positive for leg swelling. Negative for chest pain and palpitations.  Gastrointestinal: Negative for nausea, vomiting, abdominal pain, diarrhea, constipation, blood in stool and abdominal distention.  Genitourinary: Negative for dysuria, urgency, frequency, hematuria, flank pain, vaginal bleeding, vaginal discharge, difficulty urinating, vaginal pain and pelvic pain.  Musculoskeletal: Positive for arthralgias and gait problem. Negative for joint swelling.  Skin: Negative for rash.  Neurological: Negative for dizziness, syncope, speech difficulty, weakness, numbness and headaches.  Hematological: Negative for adenopathy.  Psychiatric/Behavioral: Negative for behavioral problems, dysphoric mood and agitation. The patient is not nervous/anxious.        Objective:   Physical Exam  Constitutional: She is oriented to person, place, and time. She appears well-developed and well-nourished.       Obese. Blood pressure 120/80    HENT:  Head: Normocephalic.  Right Ear: External ear normal.  Left Ear: External ear normal.  Mouth/Throat: Oropharynx is clear and moist.  Eyes: Conjunctivae and EOM are normal. Pupils are equal, round, and reactive to light.  Neck: Normal range of motion. Neck supple. No thyromegaly present.  Cardiovascular: Normal rate, regular rhythm, normal heart sounds and intact distal pulses.   Pulmonary/Chest: Effort normal and breath sounds normal.  Abdominal: Soft. Bowel sounds are normal. She exhibits no mass. There is no tenderness.  Musculoskeletal: Normal range of motion. She exhibits edema.       Both knees were examined. No active synovitis. No obvious excessive warmth or effusion Bilateral lower extremity edema  Lymphadenopathy:    She has no cervical adenopathy.  Neurological: She is alert and oriented to person, place, and time.  Skin: Skin is warm and dry. No rash noted.  Psychiatric: She has a normal mood and affect. Her behavior is normal.          Assessment & Plan:   Hypertension. Well controlled Extremity edema. Will prescribe furosemide 40 mg to take daily when necessary Dyslipidemia Osteoarthritis. Followup with orthopedic as scheduled.  Schedule complete exam

## 2010-12-15 ENCOUNTER — Other Ambulatory Visit: Payer: Self-pay | Admitting: Internal Medicine

## 2010-12-15 DIAGNOSIS — Z1231 Encounter for screening mammogram for malignant neoplasm of breast: Secondary | ICD-10-CM

## 2010-12-20 ENCOUNTER — Ambulatory Visit (INDEPENDENT_AMBULATORY_CARE_PROVIDER_SITE_OTHER): Payer: Medicare Other | Admitting: Internal Medicine

## 2010-12-20 ENCOUNTER — Encounter: Payer: Self-pay | Admitting: Internal Medicine

## 2010-12-20 VITALS — BP 124/80 | HR 84 | Temp 98.0°F | Resp 16 | Wt 210.0 lb

## 2010-12-20 DIAGNOSIS — E785 Hyperlipidemia, unspecified: Secondary | ICD-10-CM

## 2010-12-20 DIAGNOSIS — I10 Essential (primary) hypertension: Secondary | ICD-10-CM

## 2010-12-20 DIAGNOSIS — Z0000 Encounter for general adult medical examination without abnormal findings: Secondary | ICD-10-CM

## 2010-12-20 DIAGNOSIS — E119 Type 2 diabetes mellitus without complications: Secondary | ICD-10-CM

## 2010-12-20 DIAGNOSIS — E739 Lactose intolerance, unspecified: Secondary | ICD-10-CM

## 2010-12-20 LAB — COMPREHENSIVE METABOLIC PANEL
CO2: 30 mEq/L (ref 19–32)
Creatinine, Ser: 0.6 mg/dL (ref 0.4–1.2)
GFR: 109.79 mL/min (ref >60.00)
Glucose, Bld: 107 mg/dL — ABNORMAL HIGH (ref 70–99)
Total Bilirubin: 1 mg/dL (ref 0.3–1.2)

## 2010-12-20 LAB — CBC WITH DIFFERENTIAL/PLATELET
Eosinophils Relative: 0.6 % (ref 0.0–5.0)
HCT: 41 % (ref 36.0–46.0)
Hemoglobin: 13.8 g/dL (ref 12.0–15.0)
Lymphs Abs: 3.7 10*3/uL (ref 0.7–4.0)
Monocytes Relative: 5.2 % (ref 3.0–12.0)
Platelets: 298 10*3/uL (ref 150.0–400.0)
WBC: 10.2 10*3/uL (ref 4.5–10.5)

## 2010-12-20 LAB — LIPID PANEL: Triglycerides: 327 mg/dL — ABNORMAL HIGH (ref 0.0–149.0)

## 2010-12-20 LAB — HEMOGLOBIN A1C: Hgb A1c MFr Bld: 6.8 % — ABNORMAL HIGH (ref 4.6–6.5)

## 2010-12-20 LAB — TSH: TSH: 0.69 u[IU]/mL (ref 0.35–5.50)

## 2010-12-20 NOTE — Progress Notes (Signed)
Subjective:    Patient ID: Erin Williams, female    DOB: 1950/08/03, 60 y.o.   MRN: [id number]  HPI CC: cpx - doing well.   History of Present Illness:   60 year-old patient who is seen today for a health maintenance examination. Medical problems include hypertension, dyslipidemia, gastroesophageal reflux disease.   1. Risk factors, based on past  M,S,F history. Cardiovascular risk factors include dyslipidemia impaired glucose tolerance and hypertension  2.  Physical activities: Fairly sedentary due to chronic low back pain as well as arthritis involving the knees  3.  Depression/mood:  No history of depression or mood disorder  4.  Hearing: No deficits  5.  ADL's: Independent in all aspects of daily living  6.  Fall risk: Mild to moderate due to knee pain and obesity  7.  Home safety:  8.  Height weight, and visual acuity; no problems identified  9.  Counseling: Weight loss encouraged  10. Lab orders based on risk factors: Laboratory update will be reviewed including a lipid panel  11. Referral : Followup orthopedics. She has had recent colonoscopy  12. Care plan: Heart healthy diet and weight loss more regular activity all encouraged  13. Cognitive assessment: Alert and oriented with normal affect no cognitive dysfunction     Allergies:   1) ! Iodine (Iodine)  2) ! Phenylbutazone (Phenylbutazone)  3) ! Septra Ds (Sulfamethoxazole-Trimethoprim)  4) ! Tetracycline Hcl (Tetracycline Hcl)  5) ! Theophylline Cr (Theophylline)  6) Vicodin (Hydrocodone-Acetaminophen)   Past History:  Past Medical History:  Reviewed history from 11/29/2007 and no changes required.  GERD  Hyperlipidemia  Hypertension  Fibromyalgia  Mild obesity  peripheral edema  Allergic rhinitis  chronic low back pain   Past Surgical History:   Reviewed history from 10/07/2009 and no changes required.  Carpal tunnel release  Tonsillectomy  right knee arthroscopic surgery 2002    gravida one, para one, aborta zero  flexible sigmoidoscopy March 2002  colonoscopy 2012 ETT March 2007  nuclear medicine stress test June 2009  lumbar laminectomy June 2009  lumbar surgery 07-2009  normal bone density November 2009   Family History:  Reviewed history from 11/28/2006 and no changes required.  father history of diabetes, court-ordered disease with a history breast cancer, metastatic to the brain. Two brothers, one sister. Positive diabetes, hypertension  father died age 89 of aspiration pneumonia, history of diabetes, CAD, senile dementia  Mother died age 86. Breast cancer, metastatic to the brain  Two brothers, one sister  For diabetes, hypertension  that his suicide death  Family history positive colon cancer with cousins and aunts   Social History:  Reviewed history from 12/12/2008 and no changes required.  has not worked in approximately 3 years due to fibromyalgia  one daughter lives in the area  disabled due to chronic low back pain     Review of Systems  Constitutional: Negative for fever, appetite change, fatigue and unexpected weight change.  HENT: Negative for hearing loss, ear pain, nosebleeds, congestion, sore throat, mouth sores, trouble swallowing, neck stiffness, dental problem, voice change, sinus pressure and tinnitus.   Eyes: Negative for photophobia, pain, redness and visual disturbance.  Respiratory: Negative for cough, chest tightness and shortness of breath.   Cardiovascular: Negative for chest pain, palpitations and leg swelling.  Gastrointestinal: Negative for nausea, vomiting, abdominal pain, diarrhea, constipation, blood in stool, abdominal distention and rectal pain.  Genitourinary: Negative for dysuria, urgency, frequency, hematuria, flank pain, vaginal bleeding, vaginal discharge,  difficulty urinating, genital sores, vaginal pain, menstrual problem and pelvic pain.  Musculoskeletal: Positive for back pain ( chronic) and arthralgias  (bilateral knee pain left greater than the right). Myalgias:  history of fibromyalgia.  Skin: Negative for rash.  Neurological: Negative for dizziness, syncope, speech difficulty, weakness, light-headedness, numbness and headaches.  Hematological: Negative for adenopathy. Does not bruise/bleed easily.  Psychiatric/Behavioral: Negative for suicidal ideas, behavioral problems, self-injury, dysphoric mood and agitation. The patient is not nervous/anxious.        Objective:   Physical Exam  Constitutional: She is oriented to person, place, and time. She appears well-developed and well-nourished.  HENT:  Head: Normocephalic and atraumatic.  Right Ear: External ear normal.  Left Ear: External ear normal.  Mouth/Throat: Oropharynx is clear and moist.       Edentulous  Eyes: Conjunctivae and EOM are normal.  Neck: Normal range of motion. Neck supple. No JVD present. No thyromegaly present.  Cardiovascular: Normal rate, regular rhythm, normal heart sounds and intact distal pulses.   No murmur heard. Pulmonary/Chest: Effort normal and breath sounds normal. She has no wheezes. She has no rales.  Abdominal: Soft. Bowel sounds are normal. She exhibits no distension and no mass. There is no tenderness. There is no rebound and no guarding.  Genitourinary: Vagina normal.  Musculoskeletal: Normal range of motion. She exhibits no edema and no tenderness.  Neurological: She is alert and oriented to person, place, and time. She has normal reflexes. No cranial nerve deficit. She exhibits normal muscle tone. Coordination normal.  Skin: Skin is warm and dry. No rash noted.  Psychiatric: She has a normal mood and affect. Her behavior is normal.          Assessment & Plan:    Preventive health examination   Dyslipidemia  impaired glucose tolerance  hypertension well controlled  osteoarthritis   laboratory update will be reviewed. Weight loss encouraged. We'll follow up with orthopedics.

## 2010-12-20 NOTE — Patient Instructions (Signed)
Limit your sodium (Salt) intake  You need to lose weight.  Consider a lower calorie diet and regular exercise.    It is important that you exercise regularly, at least 20 minutes 3 to 4 times per week.  If you develop chest pain or shortness of breath seek  medical attention.  Return in 6 months for follow-up  

## 2010-12-20 NOTE — Progress Notes (Signed)
  Subjective:    Patient ID: Erin Williams, female    DOB: 1950-07-26, 60 y.o.   MRN: [id number]  HPI    Review of Systems     Objective:   Physical Exam        Assessment & Plan:

## 2010-12-21 LAB — LDL CHOLESTEROL, DIRECT: Direct LDL: 233.3 mg/dL

## 2010-12-21 LAB — POCT URINALYSIS DIPSTICK
Bilirubin, UA: NEGATIVE
Glucose, UA: NEGATIVE
Nitrite, UA: POSITIVE

## 2010-12-21 NOTE — Progress Notes (Signed)
Quick Note:  Spoke with pt- informed of results and discuss labs , diet and exercise - pt is taking lipitor daily ______

## 2011-01-13 ENCOUNTER — Ambulatory Visit: Payer: BC Managed Care – PPO

## 2011-02-02 DIAGNOSIS — M25569 Pain in unspecified knee: Secondary | ICD-10-CM | POA: Diagnosis not present

## 2011-02-02 DIAGNOSIS — M1710 Unilateral primary osteoarthritis, unspecified knee: Secondary | ICD-10-CM | POA: Diagnosis not present

## 2011-02-09 ENCOUNTER — Ambulatory Visit: Payer: BC Managed Care – PPO

## 2011-03-10 ENCOUNTER — Ambulatory Visit
Admission: RE | Admit: 2011-03-10 | Discharge: 2011-03-10 | Disposition: A | Discharge status: Home / Self Care | Payer: Medicare Other | Source: Ambulatory Visit | Attending: Internal Medicine | Admitting: Internal Medicine

## 2011-03-10 DIAGNOSIS — Z1231 Encounter for screening mammogram for malignant neoplasm of breast: Secondary | ICD-10-CM

## 2011-04-16 ENCOUNTER — Other Ambulatory Visit: Payer: Self-pay | Admitting: Internal Medicine

## 2011-05-13 DIAGNOSIS — M898X9 Other specified disorders of bone, unspecified site: Secondary | ICD-10-CM | POA: Diagnosis not present

## 2011-05-13 DIAGNOSIS — M25569 Pain in unspecified knee: Secondary | ICD-10-CM | POA: Diagnosis not present

## 2011-05-13 DIAGNOSIS — M1710 Unilateral primary osteoarthritis, unspecified knee: Secondary | ICD-10-CM | POA: Diagnosis not present

## 2011-05-31 ENCOUNTER — Other Ambulatory Visit: Payer: Self-pay | Admitting: Internal Medicine

## 2011-06-20 ENCOUNTER — Ambulatory Visit: Payer: BC Managed Care – PPO | Admitting: Internal Medicine

## 2011-07-06 ENCOUNTER — Ambulatory Visit (INDEPENDENT_AMBULATORY_CARE_PROVIDER_SITE_OTHER): Payer: Medicare Other | Admitting: Internal Medicine

## 2011-07-06 ENCOUNTER — Encounter: Payer: Self-pay | Admitting: Internal Medicine

## 2011-07-06 VITALS — BP 166/90 | HR 80 | Temp 98.0°F | Wt 212.0 lb

## 2011-07-06 DIAGNOSIS — E785 Hyperlipidemia, unspecified: Secondary | ICD-10-CM | POA: Diagnosis not present

## 2011-07-06 DIAGNOSIS — E739 Lactose intolerance, unspecified: Secondary | ICD-10-CM | POA: Diagnosis not present

## 2011-07-06 DIAGNOSIS — K089 Disorder of teeth and supporting structures, unspecified: Secondary | ICD-10-CM | POA: Diagnosis not present

## 2011-07-06 DIAGNOSIS — K0889 Other specified disorders of teeth and supporting structures: Secondary | ICD-10-CM

## 2011-07-06 DIAGNOSIS — I10 Essential (primary) hypertension: Secondary | ICD-10-CM

## 2011-07-06 MED ORDER — PENICILLIN V POTASSIUM 500 MG PO TABS: 500.0000 mg | ORAL_TABLET | Freq: Four times a day (QID) | ORAL | Status: AC

## 2011-07-06 MED ORDER — NAPROXEN 500 MG PO TABS: 500.0000 mg | ORAL_TABLET | Freq: Two times a day (BID) | ORAL | Status: DC

## 2011-07-06 NOTE — Progress Notes (Signed)
  Subjective:    Patient ID: Erin Williams, female    DOB: 07-04-1950, 61 y.o.   MRN: [id number]  HPI  61 year old patient who is seen today for her quarterly followup. She has a history of hypertension osteoarthritis with chronic back pain. She remains on atorvastatin 80 mg daily for dyslipidemia. She has treated hypertension controlled on diuretic therapy. She has done fairly well but over the past few days has had dental pain and feels that she probably has an abscess. She has had total extractions involving her upper plate. Otherwise has done quite well.  BP Readings from Last 3 Encounters:  07/06/11 166/90  12/20/10 124/80  11/18/10 120/80      Review of Systems  Constitutional: Negative.   HENT: Negative for hearing loss, congestion, sore throat, rhinorrhea, dental problem, sinus pressure and tinnitus.        Dental pain  Eyes: Negative for pain, discharge and visual disturbance.  Respiratory: Negative for cough and shortness of breath.   Cardiovascular: Negative for chest pain, palpitations and leg swelling.  Gastrointestinal: Negative for nausea, vomiting, abdominal pain, diarrhea, constipation, blood in stool and abdominal distention.  Genitourinary: Negative for dysuria, urgency, frequency, hematuria, flank pain, vaginal bleeding, vaginal discharge, difficulty urinating, vaginal pain and pelvic pain.  Musculoskeletal: Positive for back pain and arthralgias. Negative for joint swelling and gait problem.  Skin: Negative for rash.  Neurological: Negative for dizziness, syncope, speech difficulty, weakness, numbness and headaches.  Hematological: Negative for adenopathy.  Psychiatric/Behavioral: Negative for behavioral problems, dysphoric mood and agitation. The patient is not nervous/anxious.        Objective:   Physical Exam  Constitutional: She is oriented to person, place, and time. She appears well-developed and well-nourished.       Overweight. Repeat blood pressure  140/80  HENT:  Head: Normocephalic.  Right Ear: External ear normal.  Left Ear: External ear normal.  Mouth/Throat: Oropharynx is clear and moist.       Chronic scarring left tympanic membrane  Eyes: Conjunctivae and EOM are normal. Pupils are equal, round, and reactive to light.  Neck: Normal range of motion. Neck supple. No thyromegaly present.  Cardiovascular: Normal rate, regular rhythm, normal heart sounds and intact distal pulses.   Pulmonary/Chest: Effort normal and breath sounds normal.  Abdominal: Soft. Bowel sounds are normal. She exhibits no mass. There is no tenderness.  Musculoskeletal: Normal range of motion.  Lymphadenopathy:    She has no cervical adenopathy.  Neurological: She is alert and oriented to person, place, and time.  Skin: Skin is warm and dry. No rash noted.  Psychiatric: She has a normal mood and affect. Her behavior is normal.          Assessment & Plan:   Toothache. Probable dental abscess. We'll place on Pen-Vee K and urge prompt dental followup Hypertension probably reasonable control. We'll continue hydrochlorothiazide and Osteoarthritis stable. Medications refilled dyslipidemia stable  Recheck in 6 months for her annual exam

## 2011-07-06 NOTE — Patient Instructions (Signed)
Limit your sodium (Salt) intake    It is important that you exercise regularly, at least 20 minutes 3 to 4 times per week.  If you develop chest pain or shortness of breath seek  medical attention.  You need to lose weight.  Consider a lower calorie diet and regular exercise.  Return in 6 months for follow-up   

## 2011-07-11 DIAGNOSIS — M25569 Pain in unspecified knee: Secondary | ICD-10-CM | POA: Diagnosis not present

## 2011-07-11 DIAGNOSIS — M1710 Unilateral primary osteoarthritis, unspecified knee: Secondary | ICD-10-CM | POA: Diagnosis not present

## 2011-07-29 IMAGING — CT CT L SPINE W/O CM
2 series · 10 of 14 positions shown, 12 images · non-contrast
Comparison: Preoperative lumbar MRI 03/27/2007 and lumbar
radiographs 07/12/2007.

CLINICAL DATA: Persistent low back and right greater than left leg
pain status post lumbar fusion in June 2007.  No acute injury.

CT LUMBAR SPINE WITHOUT CONTRAST
TECHNIQUE: Multidetector CT imaging of the lumbar spine was
performed without intravenous contrast administration. Multiplanar
CT image reconstructions were also generated.

[Series 3: l spine bone · axial · 0.27mm/px · z∈[-247,-99]mm · 5 of 89 slices shown, 7 images]
[im 15/89  soft-tissue]
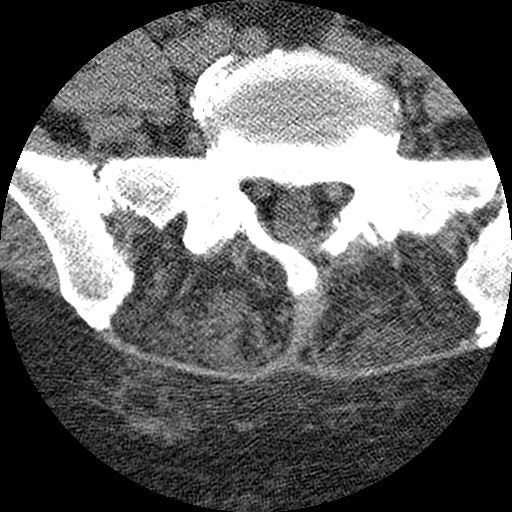
[im 15/89  bone]
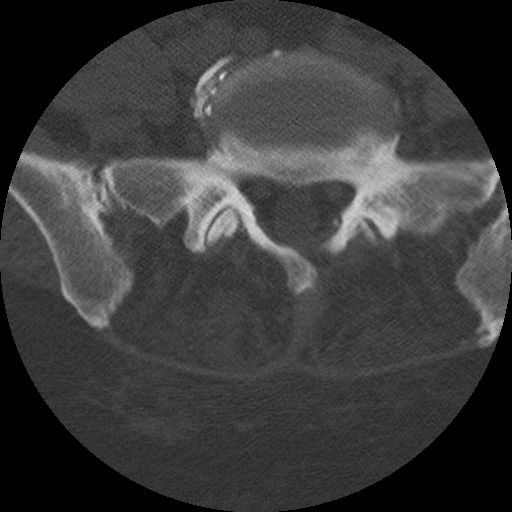
[im 30/89  bone]
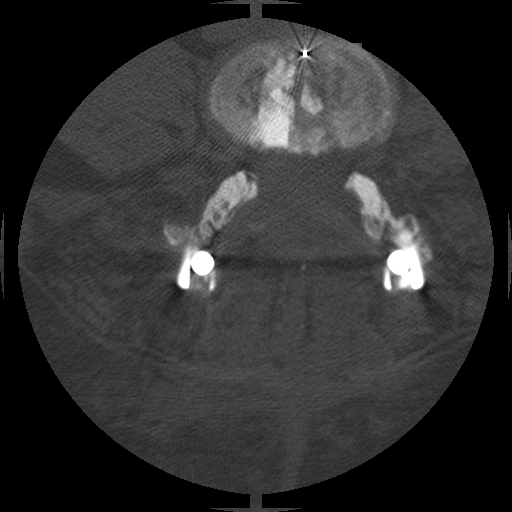
[im 45/89  bone]
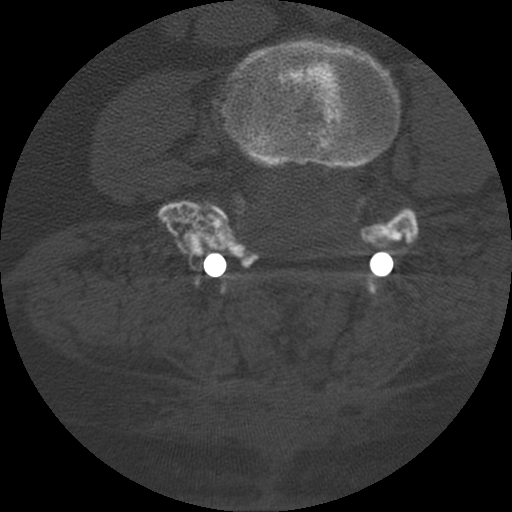
[im 59/89  bone]
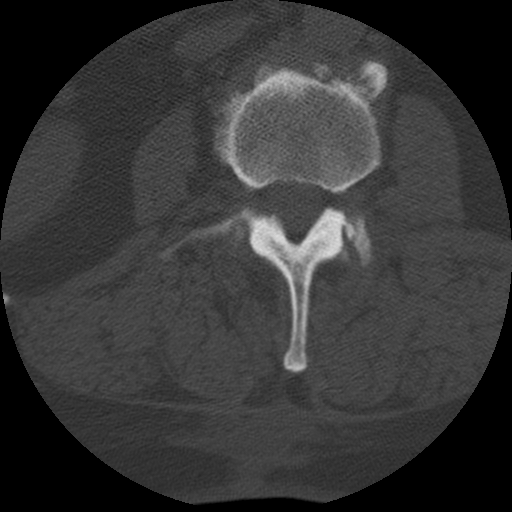
[im 74/89  soft-tissue]
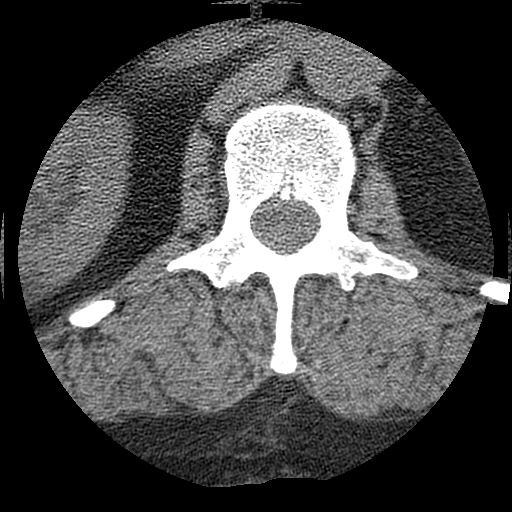
[im 74/89  bone]
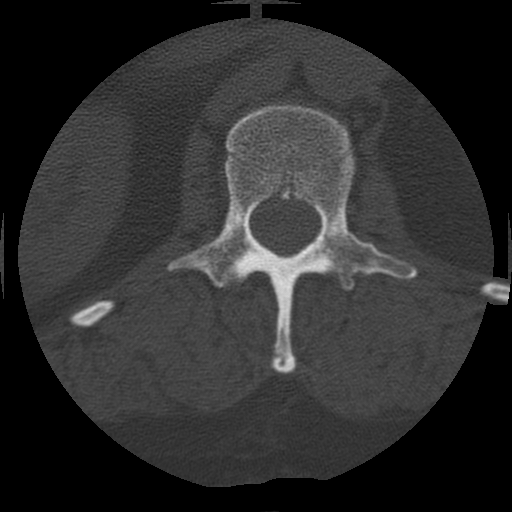

[Series 4: l spine soft · axial · 0.27mm/px · z∈[-247,-99]mm · 5 of 89 slices shown]
[im 15/89  soft-tissue]
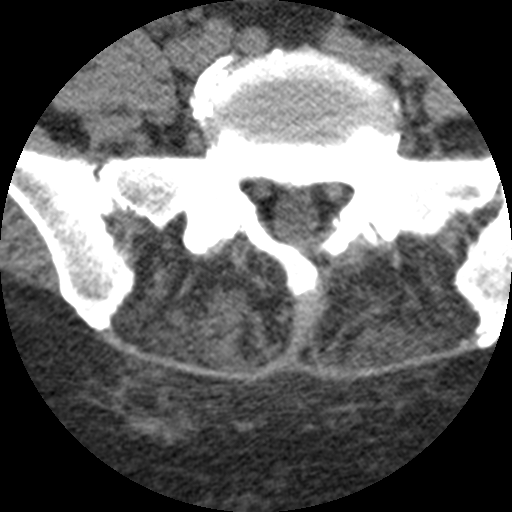
[im 30/89  soft-tissue]
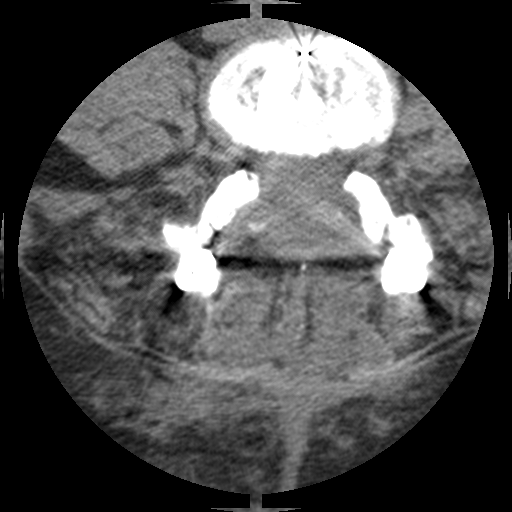
[im 45/89  soft-tissue]
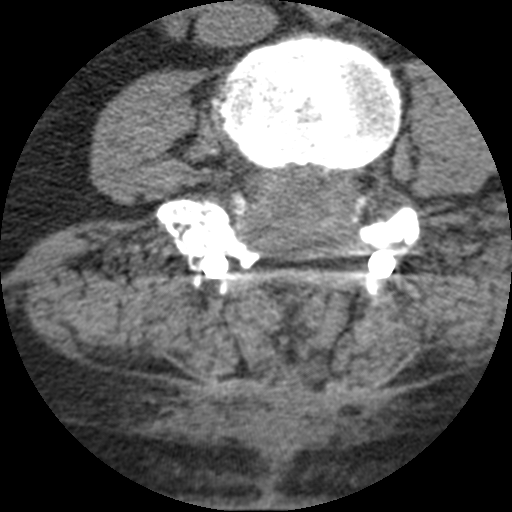
[im 59/89  soft-tissue]
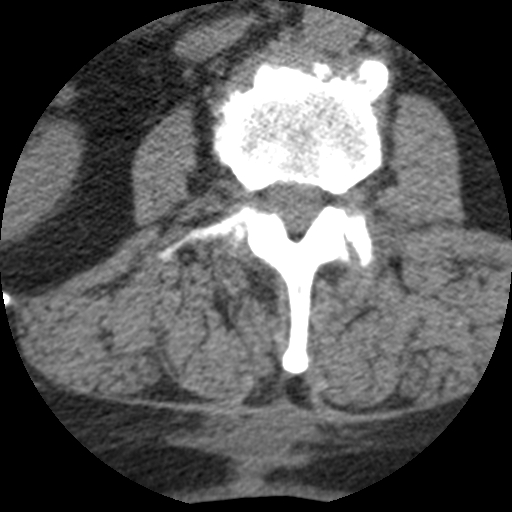
[im 74/89  soft-tissue]
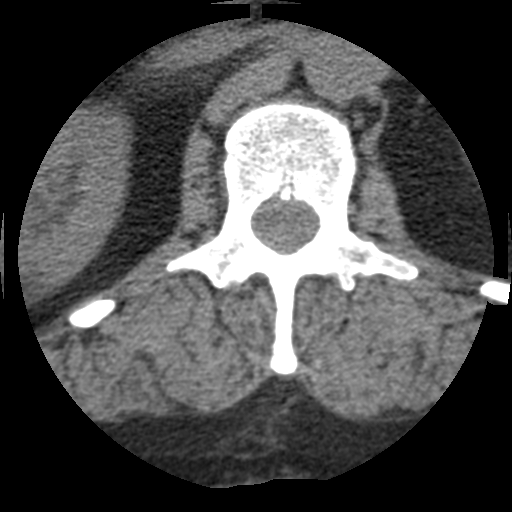

[10 of 14 positions shown; findings below may reference images not displayed]

FINDINGS: The patient is status post laminectomy with posterior
lumbar interbody fusion from L3-L5.  The anterior interbody and
posterolateral osseous fusion appears solid.  No significant
lucency surrounding the pedicle screws is identified.

T12-L1:  There is disc bulging with a calcified left paracentral
disc protrusion with covering osseous spurs.  The osteophytes
contribute to stable mild central and right foraminal stenosis.

L1-L2:  Stable disc bulging eccentric to the left and mild
bilateral facet hypertrophy.  There is possible extraforaminal
encroachment on the left L1 nerve root, but no significant
foraminal stenosis or change compared with the prior study.

L2-L3:  There has been interval development of significant adjacent
segment disease with annular disc bulging and osteophytes.  There
is vacuum phenomenon in the disc space.  There is irregularity of
the L3 superior endplate with Schmorl's node formation and some
osseous retropulsion into the spinal canal.  In addition, there is
moderate facet hypertrophy and ligamentum flavum thickening.  These
factors contribute to severe spinal and lateral recess stenosis.
Both foramina are mildly narrowed.

L3-L4:  The spinal canal and neural foramina are widely
decompressed status post laminectomy and PLIF.

L4-L5:  The spinal canal and neural foramina are well decompressed
status post laminectomy and PLIF.  Small osteophytes remain on the
left.

L5-S1:  There is stable mild disc bulging and moderate bilateral
facet hypertrophy.  Mild biforaminal stenosis appears stable
without definite L5 nerve root encroachment.
IMPRESSION: 1.  Interval development of significant adjacent segment disease at
L2-L3.  There is severe multifactorial spinal stenosis as well as
lateral recess and foraminal stenosis bilaterally.
2.  Intact hardware and solid fusion status post L3-L5 laminectomy
and PLIF.  No residual spinal stenosis or nerve root encroachment
is present at the fused levels.
3.  The additional disc space levels appear stable.  Calcified left
paracentral disc protrusion and osteophytes at T12-L1 contribute to
mild central and right foraminal stenosis.

## 2011-08-10 ENCOUNTER — Other Ambulatory Visit: Payer: Self-pay | Admitting: Internal Medicine

## 2011-08-11 NOTE — Telephone Encounter (Signed)
Suggest follow up with her dentist

## 2011-08-11 NOTE — Telephone Encounter (Signed)
Last seen 07/06/11 Last written 07/06/11 #[phone] [phone] Please advise

## 2011-08-11 NOTE — Telephone Encounter (Signed)
Denial sent to [facility]

## 2011-08-16 ENCOUNTER — Other Ambulatory Visit: Payer: Self-pay | Admitting: Internal Medicine

## 2011-10-11 ENCOUNTER — Other Ambulatory Visit: Payer: Self-pay | Admitting: Internal Medicine

## 2011-10-21 ENCOUNTER — Other Ambulatory Visit: Payer: Self-pay | Admitting: Internal Medicine

## 2011-10-25 DIAGNOSIS — M25569 Pain in unspecified knee: Secondary | ICD-10-CM | POA: Diagnosis not present

## 2011-10-25 DIAGNOSIS — M1710 Unilateral primary osteoarthritis, unspecified knee: Secondary | ICD-10-CM | POA: Diagnosis not present

## 2011-11-14 DIAGNOSIS — M1710 Unilateral primary osteoarthritis, unspecified knee: Secondary | ICD-10-CM | POA: Diagnosis not present

## 2011-12-08 ENCOUNTER — Other Ambulatory Visit: Payer: Self-pay | Admitting: Internal Medicine

## 2011-12-21 ENCOUNTER — Encounter: Payer: Self-pay | Admitting: Internal Medicine

## 2011-12-21 ENCOUNTER — Ambulatory Visit (INDEPENDENT_AMBULATORY_CARE_PROVIDER_SITE_OTHER): Payer: Medicare Other | Admitting: Internal Medicine

## 2011-12-21 VITALS — BP 146/90 | HR 84 | Temp 98.1°F | Resp 18 | Ht 61.75 in | Wt 209.0 lb

## 2011-12-21 DIAGNOSIS — D649 Anemia, unspecified: Secondary | ICD-10-CM

## 2011-12-21 DIAGNOSIS — Z23 Encounter for immunization: Secondary | ICD-10-CM

## 2011-12-21 DIAGNOSIS — E739 Lactose intolerance, unspecified: Secondary | ICD-10-CM

## 2011-12-21 DIAGNOSIS — Z0000 Encounter for general adult medical examination without abnormal findings: Secondary | ICD-10-CM

## 2011-12-21 DIAGNOSIS — I10 Essential (primary) hypertension: Secondary | ICD-10-CM | POA: Diagnosis not present

## 2011-12-21 DIAGNOSIS — E785 Hyperlipidemia, unspecified: Secondary | ICD-10-CM | POA: Diagnosis not present

## 2011-12-21 DIAGNOSIS — IMO0002 Reserved for concepts with insufficient information to code with codable children: Secondary | ICD-10-CM | POA: Diagnosis not present

## 2011-12-21 LAB — CBC WITH DIFFERENTIAL/PLATELET
Basophils Relative: 0.4 % (ref 0.0–3.0)
Hemoglobin: 13.5 g/dL (ref 12.0–15.0)
Lymphocytes Relative: 26.6 % (ref 12.0–46.0)
Monocytes Relative: 5.6 % (ref 3.0–12.0)
Neutro Abs: 8.9 10*3/uL — ABNORMAL HIGH (ref 1.4–7.7)
Neutrophils Relative %: 67 % (ref 43.0–77.0)
RBC: 4.76 Mil/uL (ref 3.87–5.11)
WBC: 13.3 10*3/uL — ABNORMAL HIGH (ref 4.5–10.5)

## 2011-12-21 LAB — COMPREHENSIVE METABOLIC PANEL
Albumin: 3.9 g/dL (ref 3.5–5.2)
BUN: 18 mg/dL (ref 6–23)
CO2: 30 mEq/L (ref 19–32)
Calcium: 9.5 mg/dL (ref 8.4–10.5)
Chloride: 101 mEq/L (ref 96–112)
Creatinine, Ser: 0.8 mg/dL (ref 0.4–1.2)
GFR: 82.96 mL/min (ref >60.00)
Glucose, Bld: 109 mg/dL — ABNORMAL HIGH (ref 70–99)

## 2011-12-21 LAB — LIPID PANEL
Cholesterol: 306 mg/dL — ABNORMAL HIGH (ref 0–200)
VLDL: 61.2 mg/dL — ABNORMAL HIGH (ref 0.0–40.0)

## 2011-12-21 LAB — TSH: TSH: 1.04 u[IU]/mL (ref 0.35–5.50)

## 2011-12-21 MED ORDER — GABAPENTIN 300 MG PO CAPS: 300.0000 mg | ORAL_CAPSULE | Freq: Three times a day (TID) | ORAL | Status: AC

## 2011-12-21 MED ORDER — FUROSEMIDE 40 MG PO TABS: 40.0000 mg | ORAL_TABLET | Freq: Every day | ORAL | Status: DC

## 2011-12-21 MED ORDER — FLUTICASONE PROPIONATE 50 MCG/ACT NA SUSP: 1.0000 | Freq: Every day | NASAL | Status: AC

## 2011-12-21 MED ORDER — FLUOXETINE HCL 20 MG PO CAPS: 20.0000 mg | ORAL_CAPSULE | Freq: Every day | ORAL | Status: DC

## 2011-12-21 MED ORDER — MELOXICAM 15 MG PO TABS: 15.0000 mg | ORAL_TABLET | Freq: Every day | ORAL | Status: AC

## 2011-12-21 MED ORDER — ATORVASTATIN CALCIUM 80 MG PO TABS: 80.0000 mg | ORAL_TABLET | Freq: Every day | ORAL | Status: DC

## 2011-12-21 MED ORDER — CYCLOBENZAPRINE HCL 10 MG PO TABS: 10.0000 mg | ORAL_TABLET | Freq: Three times a day (TID) | ORAL | Status: AC | PRN

## 2011-12-21 MED ORDER — LISINOPRIL-HYDROCHLOROTHIAZIDE 20-25 MG PO TABS: 1.0000 | ORAL_TABLET | Freq: Every day | ORAL | Status: DC

## 2011-12-21 NOTE — Progress Notes (Signed)
Subjective:    Patient ID: Erin Williams, female    DOB: 09-Jun-1950, 61 y.o.   MRN: [id number]  HPI   Subjective:    Patient ID: Erin Williams, female    DOB: 10-18-1950, 61 y.o.   MRN: [id number]  HPI CC: cpx - doing well.   History of Present Illness:   61  year-old patient who is seen today for a health maintenance examination. Medical problems include hypertension, dyslipidemia, gastroesophageal reflux disease. Her main problem is right knee pain. She is followed by an orthopedic group in [place] where her father resides and she has been receiving regular Synvisc and steroid injections with some benefit. She states she has severe arthritis involving the right knee and moderate involvement on the left. She was seen one year ago for a health maintenance exam and did have a pelvic and Pap performed at that time and a  1. Risk factors, based on past  M,S,F history. Cardiovascular risk factors include dyslipidemia impaired glucose tolerance and hypertension  2.  Physical activities: Fairly sedentary due to chronic low back pain as well as arthritis involving the knees  3.  Depression/mood:  No history of depression or mood disorder  4.  Hearing: No deficits  5.  ADL's: Independent in all aspects of daily living  6.  Fall risk: Mild to moderate due to knee pain and obesity  7.  Home safety:  8.  Height weight, and visual acuity; no problems identified  9.  Counseling: Weight loss encouraged  10. Lab orders based on risk factors: Laboratory update will be reviewed including a lipid panel  11. Referral : Followup orthopedics. She has had recent colonoscopy  12. Care plan: Heart healthy diet and weight loss more regular activity all encouraged  13. Cognitive assessment: Alert and oriented with normal affect no cognitive dysfunction     Allergies:   1) ! Iodine (Iodine)  2) ! Phenylbutazone (Phenylbutazone)  3) ! Septra Ds (Sulfamethoxazole-Trimethoprim)  4) !  Tetracycline Hcl (Tetracycline Hcl)  5) ! Theophylline Cr (Theophylline)  6) Vicodin (Hydrocodone-Acetaminophen)   Past History:  Past Medical History:  Reviewed history from 11/29/2007 and no changes required.  GERD  Hyperlipidemia  Hypertension  Fibromyalgia  Mild obesity  peripheral edema  Allergic rhinitis  chronic low back pain   Past Surgical History:   Reviewed history from 10/07/2009 and no changes required.  Carpal tunnel release  Tonsillectomy  right knee arthroscopic surgery 2002  gravida one, para one, aborta zero  flexible sigmoidoscopy March 2002  colonoscopy 2012 ETT March 2007  nuclear medicine stress test June 2009  lumbar laminectomy June 2009  lumbar surgery 07-2009  normal bone density November 2009   Family History:  Reviewed history from 11/28/2006 and no changes required.  father history of diabetes, court-ordered disease with a history breast cancer, metastatic to the brain. Two brothers, one sister. Positive diabetes, hypertension  father died age 63 of aspiration pneumonia, history of diabetes, CAD, senile dementia  Mother died age 10. Breast cancer, metastatic to the brain  Two brothers, one sister  For diabetes, hypertension  that his suicide death  Family history positive colon cancer with cousins and aunts   Social History:  Reviewed history from 12/12/2008 and no changes required.  has not worked in approximately 3 years due to fibromyalgia  one daughter lives in the area  disabled due to chronic low back pain     Review of Systems  Constitutional: Negative for fever, appetite  change, fatigue and unexpected weight change.  HENT: Negative for hearing loss, ear pain, nosebleeds, congestion, sore throat, mouth sores, trouble swallowing, neck stiffness, dental problem, voice change, sinus pressure and tinnitus.   Eyes: Negative for photophobia, pain, redness and visual disturbance.  Respiratory: Negative for cough, chest tightness and  shortness of breath.   Cardiovascular: Negative for chest pain, palpitations and leg swelling.  Gastrointestinal: Negative for nausea, vomiting, abdominal pain, diarrhea, constipation, blood in stool, abdominal distention and rectal pain.  Genitourinary: Negative for dysuria, urgency, frequency, hematuria, flank pain, vaginal bleeding, vaginal discharge, difficulty urinating, genital sores, vaginal pain, menstrual problem and pelvic pain.  Musculoskeletal: Positive for back pain ( chronic) and arthralgias (bilateral knee pain left greater than the right). Myalgias:  history of fibromyalgia.  Skin: Negative for rash.  Neurological: Negative for dizziness, syncope, speech difficulty, weakness, light-headedness, numbness and headaches.  Hematological: Negative for adenopathy. Does not bruise/bleed easily.  Psychiatric/Behavioral: Negative for suicidal ideas, behavioral problems, self-injury, dysphoric mood and agitation. The patient is not nervous/anxious.        Objective:   Physical Exam  Constitutional: She is oriented to person, place, and time. She appears well-developed and well-nourished.  HENT:  Head: Normocephalic and atraumatic.  Right Ear: External ear normal.  Left Ear: External ear normal.  Mouth/Throat: Oropharynx is clear and moist.       Edentulous  Eyes: Conjunctivae and EOM are normal.  Neck: Normal range of motion. Neck supple. No JVD present. No thyromegaly present.  Cardiovascular: Normal rate, regular rhythm, normal heart sounds and intact distal pulses.   No murmur heard. Pulmonary/Chest: Effort normal and breath sounds normal. She has no wheezes. She has no rales.  Abdominal: Soft. Bowel sounds are normal. She exhibits no distension and no mass. There is no tenderness. There is no rebound and no guarding.  Genitourinary: Vagina normal.  Musculoskeletal: Normal range of motion. She exhibits no edema and no tenderness.  Neurological: She is alert and oriented to  person, place, and time. She has normal reflexes. No cranial nerve deficit. She exhibits normal muscle tone. Coordination normal.  Skin: Skin is warm and dry. No rash noted.  Psychiatric: She has a normal mood and affect. Her behavior is normal.          Assessment & Plan:    Preventive health examination   Dyslipidemia  impaired glucose tolerance  hypertension well controlled  osteoarthritis   laboratory update will be reviewed. Weight loss encouraged. We'll follow up with orthopedics.    BP Readings from Last 3 Encounters:  12/21/11 146/90  07/06/11 166/90  12/20/10 124/80    Wt Readings from Last 3 Encounters:  12/21/11 209 lb (94.802 kg)  07/06/11 212 lb (96.163 kg)  12/20/10 210 lb (95.255 kg)    Review of Systems  Bilateral knee pain right greater than left    Objective:   Physical Exam  Blood pressure 150/90      Assessment & Plan:    Hypertension suboptimal control. We'll switch to lisinopril hydrochlorothiazide 20/25. Recheck 3 months Laboratory profile reviewed

## 2011-12-21 NOTE — Patient Instructions (Signed)

## 2012-04-04 ENCOUNTER — Ambulatory Visit: Payer: Medicare Other | Admitting: Internal Medicine

## 2012-06-01 ENCOUNTER — Telehealth: Payer: Self-pay | Admitting: Internal Medicine

## 2012-06-01 MED ORDER — FLUOXETINE HCL 20 MG PO CAPS: 20.0000 mg | ORAL_CAPSULE | Freq: Every day | ORAL | Status: AC

## 2012-06-01 NOTE — Telephone Encounter (Signed)
Called pharmacy back spoke to Dr. Kathrin Penner and gave her verbal order for Fluoxetine 20 mg, 90 day supply due to insurance.

## 2012-06-01 NOTE — Telephone Encounter (Signed)
Pharm called for pt to ask if you could change the RX: FLUoxetine (PROZAC) 20 MG capsule to a 90 day supply?  Their insurance will not pay for any less amount. Pls call pharmacy.

## 2012-10-24 ENCOUNTER — Other Ambulatory Visit: Payer: Self-pay

## 2012-10-24 DIAGNOSIS — Z1231 Encounter for screening mammogram for malignant neoplasm of breast: Secondary | ICD-10-CM

## 2012-11-15 ENCOUNTER — Other Ambulatory Visit: Payer: Self-pay

## 2012-12-05 ENCOUNTER — Ambulatory Visit
Admission: RE | Admit: 2012-12-05 | Discharge: 2012-12-05 | Disposition: A | Discharge status: Home / Self Care | Payer: Medicare Other | Source: Ambulatory Visit

## 2012-12-05 DIAGNOSIS — Z1231 Encounter for screening mammogram for malignant neoplasm of breast: Secondary | ICD-10-CM

## 2012-12-25 ENCOUNTER — Encounter: Payer: Medicare Other | Admitting: Internal Medicine

## 2012-12-26 ENCOUNTER — Other Ambulatory Visit: Payer: Self-pay | Admitting: Internal Medicine

## 2013-01-02 ENCOUNTER — Encounter: Payer: Self-pay | Admitting: Internal Medicine

## 2013-01-02 ENCOUNTER — Ambulatory Visit (INDEPENDENT_AMBULATORY_CARE_PROVIDER_SITE_OTHER): Payer: Medicare Other | Admitting: Internal Medicine

## 2013-01-02 VITALS — BP 110/80 | Temp 98.3°F | Ht 61.75 in | Wt 201.0 lb

## 2013-01-02 DIAGNOSIS — Z23 Encounter for immunization: Secondary | ICD-10-CM | POA: Diagnosis not present

## 2013-01-02 DIAGNOSIS — E739 Lactose intolerance, unspecified: Secondary | ICD-10-CM | POA: Diagnosis not present

## 2013-01-02 DIAGNOSIS — K219 Gastro-esophageal reflux disease without esophagitis: Secondary | ICD-10-CM | POA: Diagnosis not present

## 2013-01-02 DIAGNOSIS — Z0000 Encounter for general adult medical examination without abnormal findings: Secondary | ICD-10-CM

## 2013-01-02 DIAGNOSIS — I10 Essential (primary) hypertension: Secondary | ICD-10-CM

## 2013-01-02 DIAGNOSIS — E785 Hyperlipidemia, unspecified: Secondary | ICD-10-CM

## 2013-01-02 DIAGNOSIS — J309 Allergic rhinitis, unspecified: Secondary | ICD-10-CM | POA: Diagnosis not present

## 2013-01-02 DIAGNOSIS — D649 Anemia, unspecified: Secondary | ICD-10-CM

## 2013-01-02 LAB — TSH: TSH: 0.29 u[IU]/mL — ABNORMAL LOW (ref 0.35–5.50)

## 2013-01-02 LAB — LIPID PANEL
Cholesterol: 302 mg/dL — ABNORMAL HIGH (ref 0–200)
Total CHOL/HDL Ratio: 9
Triglycerides: 267 mg/dL — ABNORMAL HIGH (ref 0.0–149.0)
VLDL: 53.4 mg/dL — ABNORMAL HIGH (ref 0.0–40.0)

## 2013-01-02 LAB — CBC WITH DIFFERENTIAL/PLATELET
Basophils Relative: 0.4 % (ref 0.0–3.0)
Eosinophils Absolute: 0.1 10*3/uL (ref 0.0–0.7)
Eosinophils Relative: 1.1 % (ref 0.0–5.0)
HCT: 40.7 % (ref 36.0–46.0)
Hemoglobin: 13.4 g/dL (ref 12.0–15.0)
Lymphs Abs: 3.8 10*3/uL (ref 0.7–4.0)
MCV: 86.6 fl (ref 78.0–100.0)
Monocytes Absolute: 0.7 10*3/uL (ref 0.1–1.0)
Neutro Abs: 5.8 10*3/uL (ref 1.4–7.7)
RBC: 4.69 Mil/uL (ref 3.87–5.11)
RDW: 15.2 % — ABNORMAL HIGH (ref 11.5–14.6)
WBC: 10.4 10*3/uL (ref 4.5–10.5)

## 2013-01-02 LAB — COMPREHENSIVE METABOLIC PANEL
Alkaline Phosphatase: 98 U/L (ref 39–117)
BUN: 21 mg/dL (ref 6–23)
Glucose, Bld: 102 mg/dL — ABNORMAL HIGH (ref 70–99)
Total Bilirubin: 0.6 mg/dL (ref 0.3–1.2)

## 2013-01-02 NOTE — Addendum Note (Signed)
Addended by: Kern Reap B on: 01/02/2013 01:59 PM   Modules accepted: Orders

## 2013-01-02 NOTE — Patient Instructions (Addendum)
It is important that you exercise regularly, at least 20 minutes 3 to 4 times per week.  If you develop chest pain or shortness of breath seek  medical attention.  Return in 6 months for follow-up   You need to lose weight.  Consider a lower calorie diet and regular exercise. 

## 2013-01-02 NOTE — Progress Notes (Signed)
Pre visit review using our clinic review tool, if applicable. No additional management support is needed unless otherwise documented below in the visit note. 

## 2013-01-02 NOTE — Progress Notes (Signed)
Subjective:    Patient ID: Erin Williams, female    DOB: Jan 19, 1950, 62 y.o.   MRN: [id number]  HPI    Subjective:    Patient ID: Erin Williams, female    DOB: 1950-08-24, 62 y.o.   MRN: [id number]  HPI CC: cpx - doing well.   History of Present Illness:   62   year-old patient who is seen today for a health maintenance examination. Medical problems include hypertension, dyslipidemia, gastroesophageal reflux disease. Her main problem has been right knee pain. She is followed by an orthopedic group in [place] where her father resides and she has been receiving regular Synvisc and steroid injections with some benefit. She states she has severe arthritis involving the right knee and moderate involvement on the left. She was seen two years ago for a health maintenance exam and did have a pelvic and Pap performed at that time.  1. Risk factors, based on past  M,S,F history. Cardiovascular risk factors include dyslipidemia impaired glucose tolerance and hypertension  2.  Physical activities: Fairly sedentary due to chronic low back pain as well as arthritis involving the knees  3.  Depression/mood:  No history of depression or mood disorder  4.  Hearing: No deficits  5.  ADL's: Independent in all aspects of daily living  6.  Fall risk: Mild to moderate due to knee pain and obesity  7.  Home safety:  8.  Height weight, and visual acuity; no problems identified  9.  Counseling: Weight loss encouraged  10. Lab orders based on risk factors: Laboratory update will be reviewed including a lipid panel  11. Referral : Followup orthopedics. She has had recent colonoscopy  12. Care plan: Heart healthy diet and weight loss more regular activity all encouraged  13. Cognitive assessment: Alert and oriented with normal affect no cognitive dysfunction     Allergies:   1) ! Iodine (Iodine)  2) ! Phenylbutazone (Phenylbutazone)  3) ! Septra Ds (Sulfamethoxazole-Trimethoprim)   4) ! Tetracycline Hcl (Tetracycline Hcl)  5) ! Theophylline Cr (Theophylline)  6) Vicodin (Hydrocodone-Acetaminophen)   Past History:  Past Medical History:   GERD  Hyperlipidemia  Hypertension  Fibromyalgia  Mild obesity  peripheral edema  Allergic rhinitis  chronic low back pain   Past Surgical History:   Carpal tunnel release  Tonsillectomy  right knee arthroscopic surgery 2002  gravida one, para one, aborta zero  flexible sigmoidoscopy March 2002  colonoscopy 2012 ETT March 2007  nuclear medicine stress test June 2009  lumbar laminectomy June 2009  lumbar surgery 07-2009  normal bone density November 2009   Family History:    Two brothers, one sister. Positive diabetes, hypertension  father died age 64 of aspiration pneumonia, history of diabetes, CAD, senile dementia  Mother died age 1. Breast cancer, metastatic to the brain  Two brothers, one sister  Positive for diabetes, hypertension  suicide death  Family history positive colon cancer with cousins and aunts   Social History:   has not worked in approximately 4 years due to fibromyalgia  one daughter lives in the area  disabled due to chronic low back pain     Review of Systems  Constitutional: Negative for fever, appetite change, fatigue and unexpected weight change.  HENT: Negative for hearing loss, ear pain, nosebleeds, congestion, sore throat, mouth sores, trouble swallowing, neck stiffness, dental problem, voice change, sinus pressure and tinnitus.   Eyes: Negative for photophobia, pain, redness and visual disturbance.  Respiratory: Negative for  cough, chest tightness and shortness of breath.   Cardiovascular: Negative for chest pain, palpitations and leg swelling.  Gastrointestinal: Negative for nausea, vomiting, abdominal pain, diarrhea, constipation, blood in stool, abdominal distention and rectal pain.  Genitourinary: Negative for dysuria, urgency, frequency, hematuria, flank pain, vaginal  bleeding, vaginal discharge, difficulty urinating, genital sores, vaginal pain, menstrual problem and pelvic pain.  Musculoskeletal: Positive for back pain ( chronic) and arthralgias (bilateral knee pain left greater than the right). Myalgias:  history of fibromyalgia.  Skin: Negative for rash.  Neurological: Negative for dizziness, syncope, speech difficulty, weakness, light-headedness, numbness and headaches.  Hematological: Negative for adenopathy. Does not bruise/bleed easily.  Psychiatric/Behavioral: Negative for suicidal ideas, behavioral problems, self-injury, dysphoric mood and agitation. The patient is not nervous/anxious.        Objective:   Physical Exam  Constitutional: She is oriented to person, place, and time. She appears well-developed and well-nourished.  HENT:  Head: Normocephalic and atraumatic.  Right Ear: External ear normal.  Left Ear: External ear normal.  Mouth/Throat: Oropharynx is clear and moist.       Edentulous  Eyes: Conjunctivae and EOM are normal.  Neck: Normal range of motion. Neck supple. No JVD present. No thyromegaly present.  Cardiovascular: Normal rate, regular rhythm, normal heart sounds and intact distal pulses.   No murmur heard. Pulmonary/Chest: Effort normal and breath sounds normal. She has no wheezes. She has no rales.  Abdominal: Soft. Bowel sounds are normal. She exhibits no distension and no mass. There is no tenderness. There is no rebound and no guarding.  Genitourinary: Vagina normal.  Musculoskeletal: Normal range of motion. She exhibits no edema and no tenderness.  Neurological: She is alert and oriented to person, place, and time. She has normal reflexes. No cranial nerve deficit. She exhibits normal muscle tone. Coordination normal.  Skin: Skin is warm and dry. No rash noted.  Psychiatric: She has a normal mood and affect. Her behavior is normal.          Assessment & Plan:    Preventive health examination   Dyslipidemia   impaired glucose tolerance  hypertension well controlled  osteoarthritis   laboratory update will be reviewed. Weight loss encouraged. We'll follow up with orthopedics.    BP Readings from Last 3 Encounters:  12/21/11 146/90  07/06/11 166/90  12/20/10 124/80    Wt Readings from Last 3 Encounters:  12/21/11 209 lb (94.802 kg)  07/06/11 212 lb (96.163 kg)  12/20/10 210 lb (95.255 kg)    Review of Systems   Bilateral knee pain right greater than left    Objective:   Physical Exam   Blood pressure  110/78       Assessment & Plan:    Hypertension good control. We'll continue  lisinopril hydrochlorothiazide 20/25. Recheck 3 months Laboratory profile reviewed

## 2013-03-11 ENCOUNTER — Encounter: Payer: Self-pay | Admitting: Internal Medicine

## 2013-06-26 ENCOUNTER — Other Ambulatory Visit: Payer: Self-pay | Admitting: Internal Medicine

## 2013-06-27 ENCOUNTER — Telehealth: Payer: Self-pay | Admitting: Internal Medicine

## 2013-06-27 MED ORDER — LISINOPRIL-HYDROCHLOROTHIAZIDE 20-25 MG PO TABS: ORAL_TABLET | ORAL | Status: DC

## 2013-06-27 NOTE — Telephone Encounter (Signed)
Pt is needing new rx lisinopril-hydrochlorothiazide (PRINZIDE,ZESTORETIC) 20-25 MG per tablet, [facility] to [facility] [address] [place] [phone], pt states she only has 2 pills left.

## 2013-06-27 NOTE — Telephone Encounter (Signed)
Pt notified Rx sent to pharmacy and needs follow up. Pt verbalized understanding and stated she has an appointment in August. Told pt okay.

## 2013-06-28 ENCOUNTER — Other Ambulatory Visit: Payer: Self-pay | Admitting: Internal Medicine

## 2013-07-03 ENCOUNTER — Ambulatory Visit: Payer: Medicare Other | Admitting: Internal Medicine

## 2013-09-04 ENCOUNTER — Ambulatory Visit: Payer: Medicare Other | Admitting: Internal Medicine

## 2013-12-17 ENCOUNTER — Other Ambulatory Visit: Payer: Self-pay | Admitting: Internal Medicine

## 2014-01-06 ENCOUNTER — Encounter: Payer: Medicare Other | Admitting: Internal Medicine

## 2014-02-06 ENCOUNTER — Encounter: Payer: Self-pay | Admitting: Internal Medicine

## 2014-02-06 ENCOUNTER — Ambulatory Visit (INDEPENDENT_AMBULATORY_CARE_PROVIDER_SITE_OTHER): Payer: Medicare Other | Admitting: Internal Medicine

## 2014-02-06 VITALS — BP 120/80 | HR 81 | Temp 98.2°F | Resp 20 | Ht 61.75 in | Wt 213.0 lb

## 2014-02-06 DIAGNOSIS — I10 Essential (primary) hypertension: Secondary | ICD-10-CM

## 2014-02-06 DIAGNOSIS — E785 Hyperlipidemia, unspecified: Secondary | ICD-10-CM

## 2014-02-06 DIAGNOSIS — Z23 Encounter for immunization: Secondary | ICD-10-CM

## 2014-02-06 DIAGNOSIS — R413 Other amnesia: Secondary | ICD-10-CM

## 2014-02-06 DIAGNOSIS — E739 Lactose intolerance, unspecified: Secondary | ICD-10-CM | POA: Diagnosis not present

## 2014-02-06 LAB — COMPREHENSIVE METABOLIC PANEL
ALK PHOS: 107 U/L (ref 39–117)
ALT: 10 U/L (ref 0–35)
AST: 12 U/L (ref 0–37)
Albumin: 4 g/dL (ref 3.5–5.2)
BUN: 15 mg/dL (ref 6–23)
CALCIUM: 10 mg/dL (ref 8.4–10.5)
CHLORIDE: 103 meq/L (ref 96–112)
CO2: 27 meq/L (ref 19–32)
Creatinine, Ser: 0.7 mg/dL (ref 0.40–1.20)
GFR: 89.23 mL/min (ref >60.00)
Glucose, Bld: 91 mg/dL (ref 70–99)
Potassium: 4.5 mEq/L (ref 3.5–5.1)
Sodium: 141 mEq/L (ref 135–145)
Total Bilirubin: 0.5 mg/dL (ref 0.2–1.2)
Total Protein: 7.7 g/dL (ref 6.0–8.3)

## 2014-02-06 LAB — LIPID PANEL
Cholesterol: 298 mg/dL — ABNORMAL HIGH (ref 0–200)
HDL: 39.4 mg/dL (ref >39.00)
NonHDL: 258.6
Total CHOL/HDL Ratio: 8
Triglycerides: 369 mg/dL — ABNORMAL HIGH (ref 0.0–149.0)
VLDL: 73.8 mg/dL — ABNORMAL HIGH (ref 0.0–40.0)

## 2014-02-06 LAB — CBC WITH DIFFERENTIAL/PLATELET
Basophils Absolute: 0.1 10*3/uL (ref 0.0–0.1)
Basophils Relative: 0.5 % (ref 0.0–3.0)
EOS PCT: 0.5 % (ref 0.0–5.0)
Eosinophils Absolute: 0.1 10*3/uL (ref 0.0–0.7)
HEMATOCRIT: 41 % (ref 36.0–46.0)
Hemoglobin: 13.8 g/dL (ref 12.0–15.0)
LYMPHS PCT: 29.8 % (ref 12.0–46.0)
Lymphs Abs: 3.8 10*3/uL (ref 0.7–4.0)
MCHC: 33.7 g/dL (ref 30.0–36.0)
MCV: 86.3 fl (ref 78.0–100.0)
MONO ABS: 0.9 10*3/uL (ref 0.1–1.0)
MONOS PCT: 7.3 % (ref 3.0–12.0)
Neutro Abs: 7.8 10*3/uL — ABNORMAL HIGH (ref 1.4–7.7)
Neutrophils Relative %: 61.9 % (ref 43.0–77.0)
Platelets: 340 10*3/uL (ref 150.0–400.0)
RBC: 4.75 Mil/uL (ref 3.87–5.11)
RDW: 15.2 % (ref 11.5–15.5)
WBC: 12.6 10*3/uL — ABNORMAL HIGH (ref 4.0–10.5)

## 2014-02-06 LAB — TSH: TSH: 1.54 u[IU]/mL (ref 0.35–4.50)

## 2014-02-06 LAB — SEDIMENTATION RATE: Sed Rate: 63 mm/hr — ABNORMAL HIGH (ref 0–22)

## 2014-02-06 LAB — LDL CHOLESTEROL, DIRECT: LDL DIRECT: 213 mg/dL

## 2014-02-06 NOTE — Patient Instructions (Signed)
Limit your sodium (Salt) intake    It is important that you exercise regularly, at least 20 minutes 3 to 4 times per week.  If you develop chest pain or shortness of breath seek  medical attention.  You need to lose weight.  Consider a lower calorie diet and regular exercise.  Return in 6 months for follow-up   

## 2014-02-06 NOTE — Progress Notes (Signed)
Subjective:    Patient ID: Erin Williams, female    DOB: 04-Feb-1950, 64 y.o.   MRN: [id number]  HPI 64 year old patient who has not been seen in over one year.  She has a history of impaired glucose tolerance, dyslipidemia and treated hypertension. Her chief complaint is impaired memory.  She stated that she first started becoming slightly forgetful, back in 1998 over the past 6 months.  She feels her memory has worsened.  She often has difficulty with word finding.  When she watches reruns on TV.  She can't remember the final ending.  She feels herself repeating herself more and often must make lists to keep appointments and to remember anniversary dates.  She now must make a grocery list and often must write down instructions for her computer programs.  She states that her father had Alzheimer's disease.  MMSE 28/30;  was unable to name the date and recalled only 2 of 3 objects  Past Medical History  Diagnosis Date  . ALLERGIC RHINITIS 11/28/2006  . BACK PAIN WITH RADICULOPATHY 02/26/2007  . BENIGN NEOPLASM SKIN OTHER&UNSPEC PARTS FACE 07/18/2008  . GERD 07/10/2006  . HYPERLIPIDEMIA 07/10/2006  . HYPERTENSION 07/10/2006  . IMPAIRED GLUCOSE TOLERANCE 04/02/2008  . PRURITUS 09/28/2007  . UNSPECIFIED ANEMIA 04/02/2008  . Fibromyalgia   . Obesity   . DJD (degenerative joint disease)   . Depression     History   Social History  . Marital Status: Single    Spouse Name: N/A    Number of Children: 1  . Years of Education: N/A   Occupational History  . Retired    Social History Main Topics  . Smoking status: Never Smoker   . Smokeless tobacco: Never Used  . Alcohol Use: No  . Drug Use: No  . Sexual Activity: Not on file   Other Topics Concern  . Not on file   Social History Narrative    Past Surgical History  Procedure Laterality Date  . Carpal tunnel release    . Tonsillectomy    . Knee arthroscopy    . Lumbar laminectomy  6/ 1/ 2009    Family History  Problem  Relation Age of Onset  . Diabetes Father   . Pneumonia Father   . Coronary artery disease Father   . Dementia Father   . Breast cancer Mother   . Colon cancer Cousin   . Colon cancer Maternal Aunt   . Colon cancer Paternal Uncle   . Colon cancer Paternal Grandfather   . Heart failure Son   . Heart disease Paternal Grandfather   . Diabetes Father   . Diabetes Brother     Allergies  Allergen Reactions  . Hydrocodone-Acetaminophen     REACTION: unspecified  . Iodine     REACTION: itch  . Phenylbutazone     REACTION: itch  . Sulfamethoxazole-Trimethoprim     REACTION: itching \\T \ hives  . Tetracycline Hcl     REACTION: itch  . Theophylline   . Ciprofloxacin Rash    Current Outpatient Prescriptions on File Prior to Visit  Medication Sig Dispense Refill  . atorvastatin (LIPITOR) 80 MG tablet Take 1 tablet (80 mg total) by mouth daily. 30 tablet 11  . Calcium 1500 MG tablet Take 1,500 mg by mouth daily.      . Cholecalciferol (VITAMIN D) 1000 UNITS capsule Take 1,000 Units by mouth daily.      . cyclobenzaprine (FLEXERIL) 10 MG tablet Take 1 tablet (10 mg  total) by mouth 3 (three) times daily as needed. 30 tablet 2  . fexofenadine (ALLEGRA) 180 MG tablet Take 1 tablet (180 mg total) by mouth daily as needed. 30 tablet 11  . FLUoxetine (PROZAC) 20 MG capsule Take 1 capsule (20 mg total) by mouth daily. 90 capsule 1  . fluticasone (FLONASE) 50 MCG/ACT nasal spray Place 1 spray into the nose daily. 16 g 6  . gabapentin (NEURONTIN) 300 MG capsule Take 1 capsule (300 mg total) by mouth 3 (three) times daily. 90 capsule 11  . LIDODERM 5 % PLACE 1 PATCH ON SKIN EVERY 12 HOURS 30 patch 5  . lisinopril-hydrochlorothiazide (PRINZIDE,ZESTORETIC) 20-25 MG per tablet TAKE 1 TABLET BY MOUTH EVERY DAY 90 tablet 0  . meloxicam (MOBIC) 15 MG tablet Take 1 tablet (15 mg total) by mouth daily. 90 tablet 3  . ranitidine (ZANTAC) 150 MG tablet Take 1 tablet (150 mg total) by mouth 2 (two) times  daily. 60 tablet 11  . furosemide (LASIX) 40 MG tablet Take 1 tablet (40 mg total) by mouth daily. 1 daily as needed to control lower extremity swelling 30 tablet 6   No current facility-administered medications on file prior to visit.    BP 120/80 mmHg  Pulse 81  Temp(Src) 98.2 F (36.8 C) (Oral)  Resp 20  Ht 5' 1.75" (1.568 m)  Wt 213 lb (96.616 kg)  BMI 39.30 kg/m2  SpO2 98%    Review of Systems  Constitutional: Negative.   HENT: Negative for congestion, dental problem, hearing loss, rhinorrhea, sinus pressure, sore throat and tinnitus.   Eyes: Negative for pain, discharge and visual disturbance.  Respiratory: Negative for cough and shortness of breath.   Cardiovascular: Negative for chest pain, palpitations and leg swelling.  Gastrointestinal: Negative for nausea, vomiting, abdominal pain, diarrhea, constipation, blood in stool and abdominal distention.  Genitourinary: Negative for dysuria, urgency, frequency, hematuria, flank pain, vaginal bleeding, vaginal discharge, difficulty urinating, vaginal pain and pelvic pain.  Musculoskeletal: Negative for joint swelling, arthralgias and gait problem.  Skin: Negative for rash.  Neurological: Negative for dizziness, syncope, speech difficulty, weakness, numbness and headaches.  Hematological: Negative for adenopathy.  Psychiatric/Behavioral: Positive for decreased concentration. Negative for behavioral problems, dysphoric mood and agitation. The patient is not nervous/anxious.        Objective:   Physical Exam  Constitutional: She is oriented to person, place, and time. She appears well-developed and well-nourished.  HENT:  Head: Normocephalic.  Right Ear: External ear normal.  Left Ear: External ear normal.  Mouth/Throat: Oropharynx is clear and moist.  Eyes: Conjunctivae and EOM are normal. Pupils are equal, round, and reactive to light.  Neck: Normal range of motion. Neck supple. No thyromegaly present.  Cardiovascular:  Normal rate, regular rhythm and normal heart sounds.   Pedal pulses diminished  Pulmonary/Chest: Effort normal and breath sounds normal.  Abdominal: Soft. Bowel sounds are normal. She exhibits no mass. There is no tenderness.  Musculoskeletal: Normal range of motion.  Lymphadenopathy:    She has no cervical adenopathy.  Neurological: She is alert and oriented to person, place, and time.  Skin: Skin is warm and dry. No rash noted.  Psychiatric: She has a normal mood and affect. Her behavior is normal.          Assessment & Plan:   Forgetfulness/mild cognitive impairment Hypertension, well-controlled Impaired glucose tolerance Dyslipidemia  We'll check updated lab Schedule CPX  Will consider repeat MMSE in 6-12 months

## 2014-02-06 NOTE — Progress Notes (Signed)
Pre visit review using our clinic review tool, if applicable. No additional management support is needed unless otherwise documented below in the visit note. 

## 2014-02-07 ENCOUNTER — Other Ambulatory Visit: Payer: Self-pay | Admitting: *Deleted

## 2014-02-07 MED ORDER — ATORVASTATIN CALCIUM 80 MG PO TABS: 80.0000 mg | ORAL_TABLET | Freq: Every day | ORAL | Status: AC

## 2014-02-10 ENCOUNTER — Telehealth: Payer: Self-pay | Admitting: Internal Medicine

## 2014-02-10 NOTE — Telephone Encounter (Signed)
emmi emailed °

## 2014-03-18 ENCOUNTER — Other Ambulatory Visit: Payer: Self-pay | Admitting: Internal Medicine

## 2014-06-16 ENCOUNTER — Other Ambulatory Visit: Payer: Self-pay | Admitting: Internal Medicine

## 2014-07-04 ENCOUNTER — Ambulatory Visit (INDEPENDENT_AMBULATORY_CARE_PROVIDER_SITE_OTHER): Payer: Medicare Other | Admitting: Internal Medicine

## 2014-07-04 ENCOUNTER — Ambulatory Visit (HOSPITAL_COMMUNITY): Payer: Medicare Other | Attending: Internal Medicine

## 2014-07-04 ENCOUNTER — Encounter: Payer: Self-pay | Admitting: Internal Medicine

## 2014-07-04 VITALS — BP 110/70 | HR 115 | Temp 98.1°F | Resp 20 | Ht 61.75 in | Wt 212.0 lb

## 2014-07-04 DIAGNOSIS — M7989 Other specified soft tissue disorders: Secondary | ICD-10-CM

## 2014-07-04 DIAGNOSIS — M79604 Pain in right leg: Secondary | ICD-10-CM

## 2014-07-04 DIAGNOSIS — M79661 Pain in right lower leg: Secondary | ICD-10-CM

## 2014-07-04 DIAGNOSIS — E739 Lactose intolerance, unspecified: Secondary | ICD-10-CM

## 2014-07-04 DIAGNOSIS — I10 Essential (primary) hypertension: Secondary | ICD-10-CM | POA: Diagnosis not present

## 2014-07-04 MED ORDER — CEPHALEXIN 500 MG PO CAPS: 500.0000 mg | ORAL_CAPSULE | Freq: Four times a day (QID) | ORAL | Status: DC

## 2014-07-04 NOTE — Progress Notes (Signed)
Subjective:    Patient ID: Erin Williams, female    DOB: 09-06-1950, 64 y.o.   MRN: [id number]  HPI  64 year old patient who has a history of hypertension, as well as impaired glucose tolerance.  For the past 2 weeks she has had pain, redness and swelling involving her right leg.  This has increased in severity over the past few days.  She has been on furosemide.  She feels unwell with nausea and weakness.  For the past week.  She is also had nonproductive cough, worse at night.  Denies any chest pain or shortness of breath.  No prior history of DVT.  Past Medical History  Diagnosis Date  . ALLERGIC RHINITIS 11/28/2006  . BACK PAIN WITH RADICULOPATHY 02/26/2007  . BENIGN NEOPLASM SKIN OTHER&UNSPEC PARTS FACE 07/18/2008  . GERD 07/10/2006  . HYPERLIPIDEMIA 07/10/2006  . HYPERTENSION 07/10/2006  . IMPAIRED GLUCOSE TOLERANCE 04/02/2008  . PRURITUS 09/28/2007  . UNSPECIFIED ANEMIA 04/02/2008  . Fibromyalgia   . Obesity   . DJD (degenerative joint disease)   . Depression     History   Social History  . Marital Status: Single    Spouse Name: N/A  . Number of Children: 1  . Years of Education: N/A   Occupational History  . Retired    Social History Main Topics  . Smoking status: Never Smoker   . Smokeless tobacco: Never Used  . Alcohol Use: No  . Drug Use: No  . Sexual Activity: Not on file   Other Topics Concern  . Not on file   Social History Narrative    Past Surgical History  Procedure Laterality Date  . Carpal tunnel release    . Tonsillectomy    . Knee arthroscopy    . Lumbar laminectomy  6/ 1/ 2009    Family History  Problem Relation Age of Onset  . Diabetes Father   . Pneumonia Father   . Coronary artery disease Father   . Dementia Father   . Breast cancer Mother   . Colon cancer Cousin   . Colon cancer Maternal Aunt   . Colon cancer Paternal Uncle   . Colon cancer Paternal Grandfather   . Heart failure Son   . Heart disease Paternal Grandfather     . Diabetes Father   . Diabetes Brother     Allergies  Allergen Reactions  . Hydrocodone-Acetaminophen     REACTION: unspecified  . Iodine     REACTION: itch  . Phenylbutazone     REACTION: itch  . Sulfamethoxazole-Trimethoprim     REACTION: itching \\T \ hives  . Tetracycline Hcl     REACTION: itch  . Theophylline   . Ciprofloxacin Rash    Current Outpatient Prescriptions on File Prior to Visit  Medication Sig Dispense Refill  . atorvastatin (LIPITOR) 80 MG tablet Take 1 tablet (80 mg total) by mouth daily. 30 tablet 11  . Calcium 1500 MG tablet Take 1,500 mg by mouth daily.      . Cholecalciferol (VITAMIN D) 1000 UNITS capsule Take 1,000 Units by mouth daily.      . cyclobenzaprine (FLEXERIL) 10 MG tablet Take 1 tablet (10 mg total) by mouth 3 (three) times daily as needed. 30 tablet 2  . fexofenadine (ALLEGRA) 180 MG tablet Take 1 tablet (180 mg total) by mouth daily as needed. 30 tablet 11  . FLUoxetine (PROZAC) 20 MG capsule Take 1 capsule (20 mg total) by mouth daily. 90 capsule 1  .  fluticasone (FLONASE) 50 MCG/ACT nasal spray Place 1 spray into the nose daily. 16 g 6  . gabapentin (NEURONTIN) 300 MG capsule Take 1 capsule (300 mg total) by mouth 3 (three) times daily. 90 capsule 11  . LIDODERM 5 % PLACE 1 PATCH ON SKIN EVERY 12 HOURS 30 patch 5  . lisinopril-hydrochlorothiazide (PRINZIDE,ZESTORETIC) 20-25 MG per tablet TAKE 1 TABLET BY MOUTH EVERY DAY 90 tablet 0  . meloxicam (MOBIC) 15 MG tablet Take 1 tablet (15 mg total) by mouth daily. 90 tablet 3  . ranitidine (ZANTAC) 150 MG tablet Take 1 tablet (150 mg total) by mouth 2 (two) times daily. 60 tablet 11  . furosemide (LASIX) 40 MG tablet Take 1 tablet (40 mg total) by mouth daily. 1 daily as needed to control lower extremity swelling 30 tablet 6   No current facility-administered medications on file prior to visit.    BP 110/70 mmHg  Pulse 115  Temp(Src) 98.1 F (36.7 C) (Oral)  Resp 20  Ht 5' 1.75" (1.568 m)   Wt 212 lb (96.163 kg)  BMI 39.11 kg/m2  SpO2 98%     Review of Systems  Constitutional: Positive for fatigue.  HENT: Negative for congestion, dental problem, hearing loss, rhinorrhea, sinus pressure, sore throat and tinnitus.   Eyes: Negative for pain, discharge and visual disturbance.  Respiratory: Positive for cough. Negative for shortness of breath.   Cardiovascular: Positive for leg swelling. Negative for chest pain and palpitations.  Gastrointestinal: Positive for nausea. Negative for vomiting, abdominal pain, diarrhea, constipation, blood in stool and abdominal distention.  Genitourinary: Negative for dysuria, urgency, frequency, hematuria, flank pain, vaginal bleeding, vaginal discharge, difficulty urinating, vaginal pain and pelvic pain.  Musculoskeletal: Negative for joint swelling, arthralgias and gait problem.  Skin: Negative for rash.  Neurological: Positive for weakness. Negative for dizziness, syncope, speech difficulty, numbness and headaches.  Hematological: Negative for adenopathy.  Psychiatric/Behavioral: Negative for behavioral problems, dysphoric mood and agitation. The patient is not nervous/anxious.        Objective:   Physical Exam  Constitutional: She appears well-developed and well-nourished. No distress.  Afebrile Blood pressure low normal Pulse 110  Cardiovascular: Regular rhythm.   Pulmonary/Chest: Effort normal and breath sounds normal.  O2 saturation 98  Musculoskeletal:  The right lower leg was slightly erythematous, warm to touch and tender.  Tenderness was most marked in the popliteal and calf area          Assessment & Plan:   Swelling and erythema right lower leg.  Will check a venous Doppler to rule out DVT.  Suspect early cellulitis.  Will treat with cephalexin Hypertension, stable Impaired glucose tolerance  Will report any new or worsening symptoms (Samples of Xarelto 15 mg dispensed if positive for DVT)

## 2014-07-04 NOTE — Progress Notes (Unsigned)
Patient ID: Erin Williams, female   DOB: 13-Jan-1950, 64 y.o.   MRN: [id number] Right leg negative for DVT

## 2014-07-04 NOTE — Patient Instructions (Signed)
Take your antibiotic as prescribed until ALL of it is gone, but stop if you develop a rash, swelling, or any side effects of the medication.  Contact our office as soon as possible if  there are side effects of the medication.  Report any new or worsening symptoms  Cellulitis Cellulitis is an infection of the skin and the tissue beneath it. The infected area is usually red and tender. Cellulitis occurs most often in the arms and lower legs.  CAUSES  Cellulitis is caused by bacteria that enter the skin through cracks or cuts in the skin. The most common types of bacteria that cause cellulitis are staphylococci and streptococci. SIGNS AND SYMPTOMS   Redness and warmth.  Swelling.  Tenderness or pain.  Fever. DIAGNOSIS  Your health care provider can usually determine what is wrong based on a physical exam. Blood tests may also be done. TREATMENT  Treatment usually involves taking an antibiotic medicine. HOME CARE INSTRUCTIONS   Take your antibiotic medicine as directed by your health care provider. Finish the antibiotic even if you start to feel better.  Keep the infected arm or leg elevated to reduce swelling.  Apply a warm cloth to the affected area up to 4 times per day to relieve pain.  Take medicines only as directed by your health care provider.  Keep all follow-up visits as directed by your health care provider. SEEK MEDICAL CARE IF:   You notice red streaks coming from the infected area.  Your red area gets larger or turns dark in color.  Your bone or joint underneath the infected area becomes painful after the skin has healed.  Your infection returns in the same area or another area.  You notice a swollen bump in the infected area.  You develop new symptoms.  You have a fever. SEEK IMMEDIATE MEDICAL CARE IF:   You feel very sleepy.  You develop vomiting or diarrhea.  You have a general ill feeling (malaise) with muscle aches and pains. MAKE SURE YOU:    Understand these instructions.  Will watch your condition.  Will get help right away if you are not doing well or get worse. Document Released: 10/06/2004 Document Revised: 05/13/2013 Document Reviewed: 03/14/2011 [facility] Patient Information 2015 [place], [place]. This information is not intended to replace advice given to you by your health care provider. Make sure you discuss any questions you have with your health care provider.

## 2014-07-04 NOTE — Progress Notes (Signed)
Pre visit review using our clinic review tool, if applicable. No additional management support is needed unless otherwise documented below in the visit note. 

## 2014-08-12 ENCOUNTER — Telehealth: Payer: Self-pay | Admitting: Internal Medicine

## 2014-08-12 MED ORDER — CEPHALEXIN 500 MG PO CAPS: 500.0000 mg | ORAL_CAPSULE | Freq: Four times a day (QID) | ORAL | Status: DC

## 2014-08-12 NOTE — Telephone Encounter (Signed)
Ok #[id number]  ROV if no prompt clinical improvement

## 2014-08-12 NOTE — Telephone Encounter (Signed)
Please see message and advise 

## 2014-08-12 NOTE — Telephone Encounter (Signed)
Pt seen 6/24 dx w/ cellulitis.. Pt states it is back, from knee toabove ankle. Pt states been there about one week,\ Pt would like a refill on cephALEXin (KEFLEX) 500 MG capsule  [facility] [address]  [phone]

## 2014-08-12 NOTE — Telephone Encounter (Signed)
Spoke to pt, told her refill for Cephalexin sent to pharmacy if no improvement need to make an appointment. Pt verbalized understanding.

## 2014-09-12 ENCOUNTER — Other Ambulatory Visit: Payer: Self-pay | Admitting: Internal Medicine

## 2014-12-08 ENCOUNTER — Encounter: Payer: Self-pay | Admitting: Internal Medicine

## 2014-12-08 ENCOUNTER — Ambulatory Visit (INDEPENDENT_AMBULATORY_CARE_PROVIDER_SITE_OTHER): Payer: Medicare Other | Admitting: Internal Medicine

## 2014-12-08 VITALS — BP 130/70 | HR 89 | Temp 98.5°F | Resp 20 | Ht 61.75 in | Wt 220.0 lb

## 2014-12-08 DIAGNOSIS — M545 Low back pain, unspecified: Secondary | ICD-10-CM

## 2014-12-08 DIAGNOSIS — E785 Hyperlipidemia, unspecified: Secondary | ICD-10-CM

## 2014-12-08 DIAGNOSIS — I10 Essential (primary) hypertension: Secondary | ICD-10-CM

## 2014-12-08 DIAGNOSIS — E739 Lactose intolerance, unspecified: Secondary | ICD-10-CM

## 2014-12-08 DIAGNOSIS — Z23 Encounter for immunization: Secondary | ICD-10-CM

## 2014-12-08 DIAGNOSIS — G8929 Other chronic pain: Secondary | ICD-10-CM

## 2014-12-08 NOTE — Patient Instructions (Signed)
Limit your sodium (Salt) intake  Follow-up with Dr. Trenton Gammon  Return in 6 months for your annual exam

## 2014-12-08 NOTE — Progress Notes (Signed)
Pre visit review using our clinic review tool, if applicable. No additional management support is needed unless otherwise documented below in the visit note. 

## 2014-12-08 NOTE — Progress Notes (Signed)
Subjective:    Patient ID: Erin Williams, female    DOB: [birth year], 64 y.o.   MRN: [id number]  HPI  64 year old patient who is seen today for her biannual follow-up.  She has essential hypertension and dyslipidemia.  She remains on atorvastatin.  She is doing fairly well except for chronic low back pain.  She has had surgery in the past, but pain has worsened over the past 6 months.  CT of the lumbar spine in 2010 revealed multilevel spinal stenosis.  Pain is worsened with prolonged sitting and walking.  No leg pain or radicular symptoms  Past Medical History  Diagnosis Date  . ALLERGIC RHINITIS 11/28/2006  . BACK PAIN WITH RADICULOPATHY 02/26/2007  . BENIGN NEOPLASM SKIN OTHER&UNSPEC PARTS FACE 07/18/2008  . GERD 07/10/2006  . HYPERLIPIDEMIA 07/10/2006  . HYPERTENSION 07/10/2006  . IMPAIRED GLUCOSE TOLERANCE 04/02/2008  . PRURITUS 09/28/2007  . UNSPECIFIED ANEMIA 04/02/2008  . Fibromyalgia   . Obesity   . DJD (degenerative joint disease)   . Depression     Social History   Social History  . Marital Status: Single    Spouse Name: N/A  . Number of Children: 1  . Years of Education: N/A   Occupational History  . Retired    Social History Main Topics  . Smoking status: Never Smoker   . Smokeless tobacco: Never Used  . Alcohol Use: No  . Drug Use: No  . Sexual Activity: Not on file   Other Topics Concern  . Not on file   Social History Narrative    Past Surgical History  Procedure Laterality Date  . Carpal tunnel release    . Tonsillectomy    . Knee arthroscopy    . Lumbar laminectomy  6/ 1/ 2009    Family History  Problem Relation Age of Onset  . Diabetes Father   . Pneumonia Father   . Coronary artery disease Father   . Dementia Father   . Breast cancer Mother   . Colon cancer Cousin   . Colon cancer Maternal Aunt   . Colon cancer Paternal Uncle   . Colon cancer Paternal Grandfather   . Heart failure Son   . Heart disease Paternal Grandfather   .  Diabetes Father   . Diabetes Brother     Allergies  Allergen Reactions  . Hydrocodone-Acetaminophen     REACTION: unspecified  . Iodine     REACTION: itch  . Phenylbutazone     REACTION: itch  . Sulfamethoxazole-Trimethoprim     REACTION: itching \\T \ hives  . Tetracycline Hcl     REACTION: itch  . Theophylline   . Ciprofloxacin Rash    Current Outpatient Prescriptions on File Prior to Visit  Medication Sig Dispense Refill  . atorvastatin (LIPITOR) 80 MG tablet Take 1 tablet (80 mg total) by mouth daily. 30 tablet 11  . Calcium 1500 MG tablet Take 1,500 mg by mouth daily.      . Cholecalciferol (VITAMIN D) 1000 UNITS capsule Take 1,000 Units by mouth daily.      . cyclobenzaprine (FLEXERIL) 10 MG tablet Take 1 tablet (10 mg total) by mouth 3 (three) times daily as needed. 30 tablet 2  . fexofenadine (ALLEGRA) 180 MG tablet Take 1 tablet (180 mg total) by mouth daily as needed. 30 tablet 11  . FLUoxetine (PROZAC) 20 MG capsule Take 1 capsule (20 mg total) by mouth daily. 90 capsule 1  . fluticasone (FLONASE) 50 MCG/ACT nasal spray Place  1 spray into the nose daily. 16 g 6  . gabapentin (NEURONTIN) 300 MG capsule Take 1 capsule (300 mg total) by mouth 3 (three) times daily. 90 capsule 11  . LIDODERM 5 % PLACE 1 PATCH ON SKIN EVERY 12 HOURS 30 patch 5  . lisinopril-hydrochlorothiazide (PRINZIDE,ZESTORETIC) 20-25 MG per tablet TAKE 1 TABLET BY MOUTH EVERY DAY 90 tablet 0  . meloxicam (MOBIC) 15 MG tablet Take 1 tablet (15 mg total) by mouth daily. 90 tablet 3  . ranitidine (ZANTAC) 150 MG tablet Take 1 tablet (150 mg total) by mouth 2 (two) times daily. 60 tablet 11  . furosemide (LASIX) 40 MG tablet Take 1 tablet (40 mg total) by mouth daily. 1 daily as needed to control lower extremity swelling 30 tablet 6   No current facility-administered medications on file prior to visit.    BP 130/70 mmHg  Pulse 89  Temp(Src) 98.5 F (36.9 C) (Oral)  Resp 20  Ht 5' 1.75" (1.568 m)  Wt  220 lb (99.791 kg)  BMI 40.59 kg/m2  SpO2 98%     Review of Systems  Constitutional: Negative.   HENT: Negative for congestion, dental problem, hearing loss, rhinorrhea, sinus pressure, sore throat and tinnitus.   Eyes: Negative for pain, discharge and visual disturbance.  Respiratory: Negative for cough and shortness of breath.   Cardiovascular: Negative for chest pain, palpitations and leg swelling.  Gastrointestinal: Negative for nausea, vomiting, abdominal pain, diarrhea, constipation, blood in stool and abdominal distention.  Genitourinary: Negative for dysuria, urgency, frequency, hematuria, flank pain, vaginal bleeding, vaginal discharge, difficulty urinating, vaginal pain and pelvic pain.  Musculoskeletal: Positive for back pain. Negative for joint swelling, arthralgias and gait problem.  Skin: Negative for rash.  Neurological: Negative for dizziness, syncope, speech difficulty, weakness, numbness and headaches.  Hematological: Negative for adenopathy.  Psychiatric/Behavioral: Negative for behavioral problems, dysphoric mood and agitation. The patient is not nervous/anxious.        Objective:   Physical Exam  Constitutional: She is oriented to person, place, and time. She appears well-developed and well-nourished.  HENT:  Head: Normocephalic.  Right Ear: External ear normal.  Left Ear: External ear normal.  Mouth/Throat: Oropharynx is clear and moist.  Eyes: Conjunctivae and EOM are normal. Pupils are equal, round, and reactive to light.  Neck: Normal range of motion. Neck supple. No thyromegaly present.  Cardiovascular: Normal rate, regular rhythm, normal heart sounds and intact distal pulses.   Pulmonary/Chest: Effort normal and breath sounds normal.  Abdominal: Soft. Bowel sounds are normal. She exhibits no mass. There is no tenderness.  Musculoskeletal: Normal range of motion.  Lymphadenopathy:    She has no cervical adenopathy.  Neurological: She is alert and  oriented to person, place, and time.  Skin: Skin is warm and dry. No rash noted.  Psychiatric: She has a normal mood and affect. Her behavior is normal.          Assessment & Plan:   Hypertension, well-controlled Obesity Dyslipidemia.  Continue statin therapy Chronic back pain.  History of spinal stenosis.  Will set up referral to neurosurgery  Preventive health.  Flu vaccine administered Schedule CPX

## 2014-12-09 ENCOUNTER — Other Ambulatory Visit: Payer: Self-pay | Admitting: Internal Medicine

## 2014-12-17 DIAGNOSIS — M5136 Other intervertebral disc degeneration, lumbar region: Secondary | ICD-10-CM | POA: Diagnosis not present

## 2014-12-24 DIAGNOSIS — M5136 Other intervertebral disc degeneration, lumbar region: Secondary | ICD-10-CM | POA: Diagnosis not present

## 2014-12-26 DIAGNOSIS — M5136 Other intervertebral disc degeneration, lumbar region: Secondary | ICD-10-CM | POA: Diagnosis not present

## 2015-02-03 ENCOUNTER — Telehealth: Payer: Self-pay | Admitting: Internal Medicine

## 2015-02-03 NOTE — Telephone Encounter (Signed)
That is fine 

## 2015-02-03 NOTE — Telephone Encounter (Signed)
Pt lives in [place] and wants to know if she can come in thurs?  He only has same day appts that day?

## 2015-02-03 NOTE — Telephone Encounter (Signed)
I forgot to tell you pt thinks she may have shingles. Pt has been scheduled.  Thank you.

## 2015-02-05 ENCOUNTER — Ambulatory Visit (INDEPENDENT_AMBULATORY_CARE_PROVIDER_SITE_OTHER): Payer: Medicare Other | Admitting: Internal Medicine

## 2015-02-05 ENCOUNTER — Encounter: Payer: Self-pay | Admitting: Internal Medicine

## 2015-02-05 VITALS — BP 130/80 | HR 79 | Temp 98.2°F | Resp 20 | Ht 61.75 in | Wt 218.0 lb

## 2015-02-05 DIAGNOSIS — B029 Zoster without complications: Secondary | ICD-10-CM | POA: Diagnosis not present

## 2015-02-05 DIAGNOSIS — I10 Essential (primary) hypertension: Secondary | ICD-10-CM

## 2015-02-05 NOTE — Patient Instructions (Signed)

## 2015-02-05 NOTE — Progress Notes (Signed)
Subjective:    Patient ID: Erin Williams, female    DOB: 19-Jul-1950, 65 y.o.   MRN: [id number]  HPI  65 year old patient who is seen today with concerns about possible shingles.  She developed a rash involving the right lower back, right flank to the right lower abdominal quadrant.  Rash has been present for about 1 week She has had significant pain involving the right lumbar area and neurosurgery has performed a recent MRI.  No prior history of shingles vaccine  She has essential hypertension and dyslipidemia.  Past Medical History  Diagnosis Date  . ALLERGIC RHINITIS 11/28/2006  . BACK PAIN WITH RADICULOPATHY 02/26/2007  . BENIGN NEOPLASM SKIN OTHER&UNSPEC PARTS FACE 07/18/2008  . GERD 07/10/2006  . HYPERLIPIDEMIA 07/10/2006  . HYPERTENSION 07/10/2006  . IMPAIRED GLUCOSE TOLERANCE 04/02/2008  . PRURITUS 09/28/2007  . UNSPECIFIED ANEMIA 04/02/2008  . Fibromyalgia   . Obesity   . DJD (degenerative joint disease)   . Depression     Social History   Social History  . Marital Status: Single    Spouse Name: N/A  . Number of Children: 1  . Years of Education: N/A   Occupational History  . Retired    Social History Main Topics  . Smoking status: Never Smoker   . Smokeless tobacco: Never Used  . Alcohol Use: No  . Drug Use: No  . Sexual Activity: Not on file   Other Topics Concern  . Not on file   Social History Narrative    Past Surgical History  Procedure Laterality Date  . Carpal tunnel release    . Tonsillectomy    . Knee arthroscopy    . Lumbar laminectomy  6/ 1/ 2009    Family History  Problem Relation Age of Onset  . Diabetes Father   . Pneumonia Father   . Coronary artery disease Father   . Dementia Father   . Breast cancer Mother   . Colon cancer Cousin   . Colon cancer Maternal Aunt   . Colon cancer Paternal Uncle   . Colon cancer Paternal Grandfather   . Heart failure Son   . Heart disease Paternal Grandfather   . Diabetes Father   .  Diabetes Brother     Allergies  Allergen Reactions  . Hydrocodone-Acetaminophen     REACTION: unspecified  . Iodine     REACTION: itch  . Phenylbutazone     REACTION: itch  . Sulfamethoxazole-Trimethoprim     REACTION: itching \\T \ hives  . Tetracycline Hcl     REACTION: itch  . Theophylline   . Ciprofloxacin Rash    Current Outpatient Prescriptions on File Prior to Visit  Medication Sig Dispense Refill  . atorvastatin (LIPITOR) 80 MG tablet Take 1 tablet (80 mg total) by mouth daily. 30 tablet 11  . Calcium 1500 MG tablet Take 1,500 mg by mouth daily.      . Cholecalciferol (VITAMIN D) 1000 UNITS capsule Take 1,000 Units by mouth daily.      . cyclobenzaprine (FLEXERIL) 10 MG tablet Take 1 tablet (10 mg total) by mouth 3 (three) times daily as needed. 30 tablet 2  . fexofenadine (ALLEGRA) 180 MG tablet Take 1 tablet (180 mg total) by mouth daily as needed. 30 tablet 11  . FLUoxetine (PROZAC) 20 MG capsule Take 1 capsule (20 mg total) by mouth daily. 90 capsule 1  . fluticasone (FLONASE) 50 MCG/ACT nasal spray Place 1 spray into the nose daily. 16 g 6  .  gabapentin (NEURONTIN) 300 MG capsule Take 1 capsule (300 mg total) by mouth 3 (three) times daily. 90 capsule 11  . LIDODERM 5 % PLACE 1 PATCH ON SKIN EVERY 12 HOURS 30 patch 5  . lisinopril-hydrochlorothiazide (PRINZIDE,ZESTORETIC) 20-25 MG tablet TAKE 1 TABLET BY MOUTH EVERY DAY 90 tablet 1  . meloxicam (MOBIC) 15 MG tablet Take 1 tablet (15 mg total) by mouth daily. 90 tablet 3  . ranitidine (ZANTAC) 150 MG tablet Take 1 tablet (150 mg total) by mouth 2 (two) times daily. 60 tablet 11  . furosemide (LASIX) 40 MG tablet Take 1 tablet (40 mg total) by mouth daily. 1 daily as needed to control lower extremity swelling 30 tablet 6   No current facility-administered medications on file prior to visit.    BP 130/80 mmHg  Pulse 79  Temp(Src) 98.2 F (36.8 C) (Oral)  Resp 20  Ht 5' 1.75" (1.568 m)  Wt 218 lb (98.884 kg)  BMI  40.22 kg/m2  SpO2 98%      Review of Systems  Constitutional: Negative.   HENT: Negative for congestion, dental problem, hearing loss, rhinorrhea, sinus pressure, sore throat and tinnitus.   Eyes: Negative for pain, discharge and visual disturbance.  Respiratory: Negative for cough and shortness of breath.   Cardiovascular: Negative for chest pain, palpitations and leg swelling.  Gastrointestinal: Negative for nausea, vomiting, abdominal pain, diarrhea, constipation, blood in stool and abdominal distention.  Genitourinary: Negative for dysuria, urgency, frequency, hematuria, flank pain, vaginal bleeding, vaginal discharge, difficulty urinating, vaginal pain and pelvic pain.  Musculoskeletal: Positive for back pain. Negative for joint swelling, arthralgias and gait problem.  Skin: Positive for rash.  Neurological: Negative for dizziness, syncope, speech difficulty, weakness, numbness and headaches.  Hematological: Negative for adenopathy.  Psychiatric/Behavioral: Negative for behavioral problems, dysphoric mood and agitation. The patient is not nervous/anxious.        Objective:   Physical Exam  Constitutional: She appears well-developed and well-nourished. No distress.  Skin:  Extensive dry erythematous papular dermatitis with scaling involving the right lumbar back, right flank and right lower abdominal regions          Assessment & Plan:  Resolving herpetic dermatitis of one week's duration. Patient is comfortable and rashes nicely resolving No indication for treatment at this time  Shingles vaccine.  Encouraged for later this year  Essential hypertension, stable

## 2015-02-05 NOTE — Progress Notes (Signed)
Pre visit review using our clinic review tool, if applicable. No additional management support is needed unless otherwise documented below in the visit note. 

## 2015-02-24 DIAGNOSIS — Z6839 Body mass index (BMI) 39.0-39.9, adult: Secondary | ICD-10-CM | POA: Diagnosis not present

## 2015-02-24 DIAGNOSIS — M5416 Radiculopathy, lumbar region: Secondary | ICD-10-CM | POA: Diagnosis not present

## 2015-03-11 DIAGNOSIS — I10 Essential (primary) hypertension: Secondary | ICD-10-CM | POA: Diagnosis not present

## 2015-03-11 DIAGNOSIS — M4806 Spinal stenosis, lumbar region: Secondary | ICD-10-CM | POA: Diagnosis not present

## 2015-03-11 DIAGNOSIS — M5416 Radiculopathy, lumbar region: Secondary | ICD-10-CM | POA: Diagnosis not present

## 2015-03-11 DIAGNOSIS — Z6839 Body mass index (BMI) 39.0-39.9, adult: Secondary | ICD-10-CM | POA: Diagnosis not present

## 2015-06-04 ENCOUNTER — Other Ambulatory Visit: Payer: Self-pay | Admitting: Internal Medicine

## 2015-09-15 ENCOUNTER — Other Ambulatory Visit: Payer: Self-pay

## 2015-11-27 ENCOUNTER — Other Ambulatory Visit: Payer: Self-pay | Admitting: Internal Medicine

## 2015-12-02 ENCOUNTER — Inpatient Hospital Stay (HOSPITAL_COMMUNITY): Admission: RE | Admit: 2015-12-02 | Payer: Medicare Other | Source: Ambulatory Visit

## 2015-12-02 ENCOUNTER — Telehealth: Payer: Self-pay | Admitting: *Deleted

## 2015-12-02 ENCOUNTER — Encounter: Payer: Self-pay | Admitting: Internal Medicine

## 2015-12-02 ENCOUNTER — Ambulatory Visit (INDEPENDENT_AMBULATORY_CARE_PROVIDER_SITE_OTHER): Payer: Medicare Other | Admitting: Internal Medicine

## 2015-12-02 DIAGNOSIS — M7989 Other specified soft tissue disorders: Secondary | ICD-10-CM

## 2015-12-02 DIAGNOSIS — I10 Essential (primary) hypertension: Secondary | ICD-10-CM

## 2015-12-02 DIAGNOSIS — Z23 Encounter for immunization: Secondary | ICD-10-CM

## 2015-12-02 NOTE — Addendum Note (Signed)
Addended by: Marian Sorrow on: 12/02/2015 11:20 AM   Modules accepted: Orders

## 2015-12-02 NOTE — Addendum Note (Signed)
Addended by: Milford Cage on: 12/02/2015 11:05 AM   Modules accepted: Orders

## 2015-12-02 NOTE — Telephone Encounter (Signed)
Called [facility] at [facility] due to no result from Venous doppler on pt. Spoke to receptionist and she said pt cancelled appointment due to had to watch grandson and will call on Monday to reschedule.

## 2015-12-02 NOTE — Patient Instructions (Signed)
Venous Doppler evaluation of your right leg to rule out deep vein thrombosis.  This will be performed today  Limit your sodium (Salt) intake  Elevate your right leg as much as possible

## 2015-12-02 NOTE — Telephone Encounter (Signed)
Spoke to pt, told her I found out that she cancelled her appt. Pt said yes, she had to and will call and reschedule on Monday. Told pt it is important that she gets this done to make sure no blood clot. Offered pt to schedule her for Friday at the hospital. Pt declined and said she lives in [place] and will call and reschedule on Monday. Told pt that if her legs get more swollen or increase in pain need to go to the ED. Pt verbalized understanding.

## 2015-12-02 NOTE — Progress Notes (Signed)
Pre visit review using our clinic review tool, if applicable. No additional management support is needed unless otherwise documented below in the visit note. 

## 2015-12-02 NOTE — Progress Notes (Signed)
Subjective:    Patient ID: Erin Williams, female    DOB: 09-22-1950, 65 y.o.   MRN: [id number]  HPI  65 year old patient who presents with a three-week history of painless swelling involving her right lower leg.  Denies any pulmonary complaints.  She does have a history of essential hypertension treated with lisinopril hydrochlorothiazide.  She states the swelling is constant and not alleviated by elevation. No prior history of DVT  Past Medical History:  Diagnosis Date  . ALLERGIC RHINITIS 11/28/2006  . BACK PAIN WITH RADICULOPATHY 02/26/2007  . BENIGN NEOPLASM SKIN OTHER&UNSPEC PARTS FACE 07/18/2008  . Depression   . DJD (degenerative joint disease)   . Fibromyalgia   . GERD 07/10/2006  . HYPERLIPIDEMIA 07/10/2006  . HYPERTENSION 07/10/2006  . IMPAIRED GLUCOSE TOLERANCE 04/02/2008  . Obesity   . PRURITUS 09/28/2007  . UNSPECIFIED ANEMIA 04/02/2008     Social History   Social History  . Marital status: Single    Spouse name: N/A  . Number of children: 1  . Years of education: N/A   Occupational History  . Retired Retired   Social History Main Topics  . Smoking status: Never Smoker  . Smokeless tobacco: Never Used  . Alcohol use No  . Drug use: No  . Sexual activity: Not on file   Other Topics Concern  . Not on file   Social History Narrative  . No narrative on file    Past Surgical History:  Procedure Laterality Date  . CARPAL TUNNEL RELEASE    . KNEE ARTHROSCOPY    . LUMBAR LAMINECTOMY  6/ 1/ 2009  . TONSILLECTOMY      Family History  Problem Relation Age of Onset  . Diabetes Father   . Pneumonia Father   . Coronary artery disease Father   . Dementia Father   . Breast cancer Mother   . Colon cancer Cousin   . Colon cancer Maternal Aunt   . Colon cancer Paternal Uncle   . Colon cancer Paternal Grandfather   . Heart failure Son   . Heart disease Paternal Grandfather   . Diabetes Father   . Diabetes Brother     Allergies  Allergen Reactions    . Hydrocodone-Acetaminophen     REACTION: unspecified  . Iodine     REACTION: itch  . Phenylbutazone     REACTION: itch  . Sulfamethoxazole-Trimethoprim     REACTION: itching \\T \ hives  . Tetracycline Hcl     REACTION: itch  . Theophylline   . Ciprofloxacin Rash    Current Outpatient Prescriptions on File Prior to Visit  Medication Sig Dispense Refill  . atorvastatin (LIPITOR) 80 MG tablet Take 1 tablet (80 mg total) by mouth daily. 30 tablet 11  . Calcium 1500 MG tablet Take 1,500 mg by mouth daily.      . Cholecalciferol (VITAMIN D) 1000 UNITS capsule Take 1,000 Units by mouth daily.      . cyclobenzaprine (FLEXERIL) 10 MG tablet Take 1 tablet (10 mg total) by mouth 3 (three) times daily as needed. 30 tablet 2  . fexofenadine (ALLEGRA) 180 MG tablet Take 1 tablet (180 mg total) by mouth daily as needed. 30 tablet 11  . FLUoxetine (PROZAC) 20 MG capsule Take 1 capsule (20 mg total) by mouth daily. 90 capsule 1  . fluticasone (FLONASE) 50 MCG/ACT nasal spray Place 1 spray into the nose daily. 16 g 6  . gabapentin (NEURONTIN) 300 MG capsule Take 1 capsule (300 mg  total) by mouth 3 (three) times daily. 90 capsule 11  . LIDODERM 5 % PLACE 1 PATCH ON SKIN EVERY 12 HOURS 30 patch 5  . lisinopril-hydrochlorothiazide (PRINZIDE,ZESTORETIC) 20-25 MG tablet TAKE 1 TABLET BY MOUTH EVERY DAY 90 tablet 1  . meloxicam (MOBIC) 15 MG tablet Take 1 tablet (15 mg total) by mouth daily. 90 tablet 3  . ranitidine (ZANTAC) 150 MG tablet Take 1 tablet (150 mg total) by mouth 2 (two) times daily. 60 tablet 11  . furosemide (LASIX) 40 MG tablet Take 1 tablet (40 mg total) by mouth daily. 1 daily as needed to control lower extremity swelling 30 tablet 6   No current facility-administered medications on file prior to visit.     BP 120/64 (BP Location: Left Arm, Patient Position: Sitting, Cuff Size: Large)   Pulse 98   Temp 97.6 F (36.4 C) (Oral)   Ht 5\' 1"  (1.549 m)   Wt 214 lb 3.2 oz (97.2 kg)    SpO2 98%   BMI 40.47 kg/m     Review of Systems  Constitutional: Negative.   HENT: Negative for congestion, dental problem, hearing loss, rhinorrhea, sinus pressure, sore throat and tinnitus.   Eyes: Negative for pain, discharge and visual disturbance.  Respiratory: Negative for cough, chest tightness and shortness of breath.   Cardiovascular: Positive for leg swelling. Negative for chest pain and palpitations.  Gastrointestinal: Negative for abdominal distention, abdominal pain, blood in stool, constipation, diarrhea, nausea and vomiting.  Genitourinary: Negative for difficulty urinating, dysuria, flank pain, frequency, hematuria, pelvic pain, urgency, vaginal bleeding, vaginal discharge and vaginal pain.  Musculoskeletal: Negative for arthralgias, gait problem and joint swelling.  Skin: Negative for rash.  Neurological: Negative for dizziness, syncope, speech difficulty, weakness, numbness and headaches.  Hematological: Negative for adenopathy.  Psychiatric/Behavioral: Negative for agitation, behavioral problems and dysphoric mood. The patient is not nervous/anxious.        Objective:   Physical Exam  Constitutional: She appears well-developed and well-nourished. No distress.  Cardiovascular: Regular rhythm.   No tachycardia  Pulmonary/Chest: Effort normal and breath sounds normal.  O2 saturation 98  Musculoskeletal: She exhibits edema.  Swelling distal to the right knee.  No calf tenderness.  Negative Homans          Assessment & Plan:   Right leg swelling distal to the knee.  No calf tenderness.  Rule out DVT.  Will schedule her extremity venous Doppler study Essential hypertension, well-controlled Preventive health.  Flu vaccine administered  Nyoka Cowden

## 2015-12-07 ENCOUNTER — Telehealth: Payer: Self-pay | Admitting: Internal Medicine

## 2015-12-07 MED ORDER — FUROSEMIDE 40 MG PO TABS: 40.0000 mg | ORAL_TABLET | Freq: Every day | ORAL | 6 refills | Status: DC

## 2015-12-07 NOTE — Telephone Encounter (Signed)
Attempted to call pt X 2. VM full. set up, unable to leave message

## 2015-12-07 NOTE — Telephone Encounter (Signed)
Pt was supposed to have xray last week at [facility], but had grandsons with her and could not go. Pt lives in [place] and would like to have the xray done in [place]. [place] [facility] states they can do with an order form the dr. Lilian Coma you fax to  [phone]

## 2015-12-07 NOTE — Telephone Encounter (Signed)
Dr.K, notified pt's wants done at [facility] in [place] where she lives. Order written and signed by Dr.K.

## 2015-12-07 NOTE — Telephone Encounter (Signed)
° ° °  Pharmacy call and said pt said shr saw the doctor last week and the following rx was suppose to be called in   furosemide (LASIX) 40 MG tablet(Expired)

## 2015-12-07 NOTE — Telephone Encounter (Signed)
Rx sent to pharmacy   

## 2016-01-05 ENCOUNTER — Ambulatory Visit (INDEPENDENT_AMBULATORY_CARE_PROVIDER_SITE_OTHER): Payer: Medicare Other | Admitting: Adult Health

## 2016-01-05 ENCOUNTER — Inpatient Hospital Stay (HOSPITAL_COMMUNITY): Admission: RE | Admit: 2016-01-05 | Payer: Medicare Other | Source: Ambulatory Visit

## 2016-01-05 ENCOUNTER — Encounter: Payer: Self-pay | Admitting: Adult Health

## 2016-01-05 VITALS — BP 160/90 | Temp 97.6°F | Ht 61.0 in | Wt 216.3 lb

## 2016-01-05 DIAGNOSIS — R600 Localized edema: Secondary | ICD-10-CM | POA: Diagnosis not present

## 2016-01-05 NOTE — Patient Instructions (Signed)
It was great meeting you today   Please get your ultrasound done of the right leg.   Take lasix every day. Elevate your leg above your heart and follow a low sodium diet.   Follow up with Dr. Raliegh Ip if no improvement

## 2016-01-05 NOTE — Progress Notes (Signed)
Subjective:    Patient ID: Erin Williams, female    DOB: 05/07/1950, 65 y.o.   MRN: [id number]  HPI 65 year old female who  has a past medical history of ALLERGIC RHINITIS (11/28/2006); BACK PAIN WITH RADICULOPATHY (02/26/2007); BENIGN NEOPLASM SKIN OTHER&UNSPEC PARTS FACE (07/18/2008); Depression; DJD (degenerative joint disease); Fibromyalgia; GERD (07/10/2006); HYPERLIPIDEMIA (07/10/2006); HYPERTENSION (07/10/2006); IMPAIRED GLUCOSE TOLERANCE (04/02/2008); Obesity; PRURITUS (09/28/2007); and UNSPECIFIED ANEMIA (04/02/2008). She is a patient of Dr. Raliegh Ip who I am seeing today for the first time. She reports that her leg has been swelling for the last 6 weeks.   She was last seen by Dr. Raliegh Ip on 12/02/2015 for this issue and was advised to get a DVT study done. She had to cancel this study.   She has been taking her Lasix as needed, she reports not taking this medication every day. The last time she took her Lasix was 24 hours ago.   She reports pain in her toes x 2 weeks. The pain is constant " and feels like someone stepped on it."   She denies any pulmonary complaints or chest pain. Denies any redness, warmth or swelling to her calf.   Has slight discomfort in calf with flexion of right foot    Review of Systems  Constitutional: Negative.   Respiratory: Negative.   Cardiovascular: Positive for leg swelling. Negative for chest pain and palpitations.  Skin: Positive for color change (right toes).  Neurological: Negative.   All other systems reviewed and are negative.  Past Medical History:  Diagnosis Date  . ALLERGIC RHINITIS 11/28/2006  . BACK PAIN WITH RADICULOPATHY 02/26/2007  . BENIGN NEOPLASM SKIN OTHER&UNSPEC PARTS FACE 07/18/2008  . Depression   . DJD (degenerative joint disease)   . Fibromyalgia   . GERD 07/10/2006  . HYPERLIPIDEMIA 07/10/2006  . HYPERTENSION 07/10/2006  . IMPAIRED GLUCOSE TOLERANCE 04/02/2008  . Obesity   . PRURITUS 09/28/2007  . UNSPECIFIED ANEMIA 04/02/2008     Social History   Social History  . Marital status: Single    Spouse name: N/A  . Number of children: 1  . Years of education: N/A   Occupational History  . Retired Retired   Social History Main Topics  . Smoking status: Never Smoker  . Smokeless tobacco: Never Used  . Alcohol use No  . Drug use: No  . Sexual activity: Not on file   Other Topics Concern  . Not on file   Social History Narrative  . No narrative on file    Past Surgical History:  Procedure Laterality Date  . CARPAL TUNNEL RELEASE    . KNEE ARTHROSCOPY    . LUMBAR LAMINECTOMY  6/ 1/ 2009  . TONSILLECTOMY      Family History  Problem Relation Age of Onset  . Diabetes Father   . Pneumonia Father   . Coronary artery disease Father   . Dementia Father   . Breast cancer Mother   . Colon cancer Cousin   . Colon cancer Maternal Aunt   . Colon cancer Paternal Uncle   . Colon cancer Paternal Grandfather   . Heart failure Son   . Heart disease Paternal Grandfather   . Diabetes Father   . Diabetes Brother     Allergies  Allergen Reactions  . Hydrocodone-Acetaminophen     REACTION: unspecified  . Iodine     REACTION: itch  . Phenylbutazone     REACTION: itch  . Sulfamethoxazole-Trimethoprim     REACTION: itching \  T\ hives  . Tetracycline Hcl     REACTION: itch  . Theophylline   . Ciprofloxacin Rash    Current Outpatient Prescriptions on File Prior to Visit  Medication Sig Dispense Refill  . atorvastatin (LIPITOR) 80 MG tablet Take 1 tablet (80 mg total) by mouth daily. 30 tablet 11  . Calcium 1500 MG tablet Take 1,500 mg by mouth daily.      . Cholecalciferol (VITAMIN D) 1000 UNITS capsule Take 1,000 Units by mouth daily.      . cyclobenzaprine (FLEXERIL) 10 MG tablet Take 1 tablet (10 mg total) by mouth 3 (three) times daily as needed. 30 tablet 2  . fexofenadine (ALLEGRA) 180 MG tablet Take 1 tablet (180 mg total) by mouth daily as needed. 30 tablet 11  . FLUoxetine (PROZAC) 20 MG  capsule Take 1 capsule (20 mg total) by mouth daily. 90 capsule 1  . fluticasone (FLONASE) 50 MCG/ACT nasal spray Place 1 spray into the nose daily. 16 g 6  . furosemide (LASIX) 40 MG tablet Take 1 tablet (40 mg total) by mouth daily. 1 daily as needed to control lower extremity swelling 30 tablet 6  . gabapentin (NEURONTIN) 300 MG capsule Take 1 capsule (300 mg total) by mouth 3 (three) times daily. 90 capsule 11  . LIDODERM 5 % PLACE 1 PATCH ON SKIN EVERY 12 HOURS 30 patch 5  . lisinopril-hydrochlorothiazide (PRINZIDE,ZESTORETIC) 20-25 MG tablet TAKE 1 TABLET BY MOUTH EVERY DAY 90 tablet 1  . meloxicam (MOBIC) 15 MG tablet Take 1 tablet (15 mg total) by mouth daily. 90 tablet 3  . ranitidine (ZANTAC) 150 MG tablet Take 1 tablet (150 mg total) by mouth 2 (two) times daily. 60 tablet 11   No current facility-administered medications on file prior to visit.     BP (!) 160/90   Temp 97.6 F (36.4 C) (Oral)   Ht 5\' 1"  (1.549 m)   Wt 216 lb 5 oz (98.1 kg)   BMI 40.87 kg/m       Objective:   Physical Exam  Constitutional: She is oriented to person, place, and time. She appears well-developed and well-nourished. No distress.  Cardiovascular: Normal rate, regular rhythm, normal heart sounds and intact distal pulses.  Exam reveals no gallop and no friction rub.   No murmur heard. Pulmonary/Chest: Effort normal and breath sounds normal. No respiratory distress. She has no wheezes. She has no rales. She exhibits no tenderness.  Neurological: She is alert and oriented to person, place, and time. She has normal reflexes.  Skin: Skin is warm and dry. She is not diaphoretic. There is erythema (right second toe. ).  Good cap refill. Distal pulses intact   Psychiatric: She has a normal mood and affect. Her behavior is normal. Judgment and thought content normal.  Nursing note and vitals reviewed.     Assessment & Plan:  1. Leg edema, right - Likely from fluid overload. Will get [place] to r/o DVT-  this is not likely.  - VAS [place] LOWER EXTREMITY VENOUS (DVT); Future - Advised to take lasix daily.  - Low sodium diet - Elevate legs above heart - Follow up if no improvement   Dorothyann Peng, NP

## 2016-01-05 NOTE — Progress Notes (Signed)
Pre visit review using our clinic review tool, if applicable. No additional management support is needed unless otherwise documented below in the visit note. 

## 2016-01-06 ENCOUNTER — Ambulatory Visit: Payer: Medicare Other | Admitting: Family Medicine

## 2016-06-03 ENCOUNTER — Other Ambulatory Visit: Payer: Self-pay | Admitting: Internal Medicine

## 2016-08-01 ENCOUNTER — Other Ambulatory Visit: Payer: Self-pay | Admitting: Internal Medicine

## 2016-09-29 ENCOUNTER — Encounter: Payer: Self-pay | Admitting: Internal Medicine

## 2016-11-26 ENCOUNTER — Other Ambulatory Visit: Payer: Self-pay | Admitting: Internal Medicine

## 2017-06-24 ENCOUNTER — Other Ambulatory Visit: Payer: Self-pay | Admitting: Internal Medicine

## 2017-06-27 NOTE — Telephone Encounter (Signed)
Pt needs to make an appointment for more refills. Pt vm is full want able to leave a vm.

## 2017-07-25 ENCOUNTER — Other Ambulatory Visit: Payer: Self-pay | Admitting: Internal Medicine

## 2017-07-25 NOTE — Telephone Encounter (Signed)
Patient need to schedule an ov for more refills. 

## 2018-02-08 ENCOUNTER — Other Ambulatory Visit: Payer: Self-pay | Admitting: Internal Medicine

## 2018-08-09 ENCOUNTER — Other Ambulatory Visit: Payer: Self-pay

## 2020-05-26 ENCOUNTER — Encounter: Payer: Self-pay | Admitting: Gastroenterology

## 2020-08-21 ENCOUNTER — Encounter: Payer: Self-pay | Admitting: Gastroenterology

## 2020-10-10 DEATH — deceased
